# Patient Record
Sex: Male | Born: 1945 | Race: White | Hispanic: No | Marital: Single | Smoking: Former smoker
Health system: Southern US, Community
[De-identification: ages and names within clinical notes are randomized; demographics above are authoritative.]

## PROBLEM LIST (undated history)

## (undated) DIAGNOSIS — M1612 Unilateral primary osteoarthritis, left hip: Secondary | ICD-10-CM

## (undated) DIAGNOSIS — I10 Essential (primary) hypertension: Secondary | ICD-10-CM

## (undated) DIAGNOSIS — C439 Malignant melanoma of skin, unspecified: Secondary | ICD-10-CM

## (undated) DIAGNOSIS — J449 Chronic obstructive pulmonary disease, unspecified: Secondary | ICD-10-CM

## (undated) DIAGNOSIS — M87051 Idiopathic aseptic necrosis of right femur: Secondary | ICD-10-CM

## (undated) DIAGNOSIS — M87052 Idiopathic aseptic necrosis of left femur: Secondary | ICD-10-CM

## (undated) DIAGNOSIS — F209 Schizophrenia, unspecified: Secondary | ICD-10-CM

## (undated) DIAGNOSIS — Z9981 Dependence on supplemental oxygen: Secondary | ICD-10-CM

## (undated) DIAGNOSIS — K5792 Diverticulitis of intestine, part unspecified, without perforation or abscess without bleeding: Secondary | ICD-10-CM

## (undated) DIAGNOSIS — G4733 Obstructive sleep apnea (adult) (pediatric): Secondary | ICD-10-CM

## (undated) DIAGNOSIS — F419 Anxiety disorder, unspecified: Secondary | ICD-10-CM

## (undated) HISTORY — DX: Diverticulitis of intestine, part unspecified, without perforation or abscess without bleeding: K57.92

## (undated) HISTORY — DX: Obstructive sleep apnea (adult) (pediatric): G47.33

## (undated) HISTORY — DX: Idiopathic aseptic necrosis of left femur: M87.052

## (undated) HISTORY — DX: Essential (primary) hypertension: I10

## (undated) HISTORY — DX: Schizophrenia, unspecified: F20.9

## (undated) HISTORY — DX: Idiopathic aseptic necrosis of left femur: M87.051

## (undated) HISTORY — DX: Malignant melanoma of skin, unspecified: C43.9

## (undated) HISTORY — PX: HERNIA REPAIR: SHX51

## (undated) HISTORY — PX: CHOLECYSTECTOMY: SHX55

---
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
## 2005-01-30 ENCOUNTER — Ambulatory Visit (HOSPITAL_COMMUNITY): Payer: Self-pay | Admitting: Psychiatry

## 2005-02-26 ENCOUNTER — Ambulatory Visit (HOSPITAL_COMMUNITY): Payer: Self-pay | Admitting: Psychiatry

## 2005-04-01 ENCOUNTER — Ambulatory Visit (HOSPITAL_COMMUNITY): Payer: Self-pay | Admitting: Psychiatry

## 2005-07-05 ENCOUNTER — Ambulatory Visit (HOSPITAL_COMMUNITY): Payer: Self-pay | Admitting: Psychiatry

## 2005-09-04 ENCOUNTER — Ambulatory Visit (HOSPITAL_COMMUNITY): Payer: Self-pay | Admitting: Psychiatry

## 2005-09-18 ENCOUNTER — Ambulatory Visit (HOSPITAL_COMMUNITY): Payer: Self-pay | Admitting: Psychiatry

## 2005-11-13 ENCOUNTER — Ambulatory Visit (HOSPITAL_COMMUNITY): Payer: Self-pay | Admitting: Psychiatry

## 2005-12-27 ENCOUNTER — Ambulatory Visit (HOSPITAL_COMMUNITY): Payer: Self-pay | Admitting: Psychiatry

## 2006-02-28 ENCOUNTER — Ambulatory Visit (HOSPITAL_COMMUNITY): Payer: Self-pay | Admitting: Psychiatry

## 2006-05-23 ENCOUNTER — Ambulatory Visit (HOSPITAL_COMMUNITY): Payer: Self-pay | Admitting: Psychiatry

## 2012-05-20 DIAGNOSIS — C439 Malignant melanoma of skin, unspecified: Secondary | ICD-10-CM

## 2012-05-20 HISTORY — DX: Malignant melanoma of skin, unspecified: C43.9

## 2012-10-01 HISTORY — PX: MELANOMA EXCISION: SHX5266

## 2013-01-15 ENCOUNTER — Encounter: Payer: Self-pay | Admitting: Family

## 2013-01-15 ENCOUNTER — Ambulatory Visit (INDEPENDENT_AMBULATORY_CARE_PROVIDER_SITE_OTHER): Payer: Medicare HMO | Admitting: Family

## 2013-01-15 VITALS — BP 100/70 | HR 59 | Temp 97.9°F | Resp 16 | Ht 62.0 in | Wt 266.0 lb

## 2013-01-15 DIAGNOSIS — C439 Malignant melanoma of skin, unspecified: Secondary | ICD-10-CM | POA: Insufficient documentation

## 2013-01-15 DIAGNOSIS — E669 Obesity, unspecified: Secondary | ICD-10-CM

## 2013-01-15 DIAGNOSIS — H6120 Impacted cerumen, unspecified ear: Secondary | ICD-10-CM | POA: Insufficient documentation

## 2013-01-15 DIAGNOSIS — H6123 Impacted cerumen, bilateral: Secondary | ICD-10-CM

## 2013-01-15 DIAGNOSIS — F209 Schizophrenia, unspecified: Secondary | ICD-10-CM

## 2013-01-15 DIAGNOSIS — I10 Essential (primary) hypertension: Secondary | ICD-10-CM

## 2013-01-15 DIAGNOSIS — M109 Gout, unspecified: Secondary | ICD-10-CM | POA: Insufficient documentation

## 2013-01-15 DIAGNOSIS — G4733 Obstructive sleep apnea (adult) (pediatric): Secondary | ICD-10-CM | POA: Insufficient documentation

## 2013-01-15 DIAGNOSIS — E291 Testicular hypofunction: Secondary | ICD-10-CM | POA: Insufficient documentation

## 2013-01-15 LAB — BASIC METABOLIC PANEL
BUN: 15 mg/dL (ref 6–23)
Chloride: 103 mEq/L (ref 96–112)
Glucose, Bld: 92 mg/dL (ref 70–99)
Potassium: 3.9 mEq/L (ref 3.5–5.3)
Sodium: 142 mEq/L (ref 135–145)

## 2013-01-15 LAB — HEPATIC FUNCTION PANEL
ALT: 20 U/L (ref 0–53)
AST: 21 U/L (ref 0–37)
Alkaline Phosphatase: 51 U/L (ref 39–117)
Bilirubin, Direct: 0.1 mg/dL (ref 0.0–0.3)
Total Bilirubin: 0.6 mg/dL (ref 0.3–1.2)

## 2013-01-15 LAB — LIPID PANEL
Cholesterol: 151 mg/dL (ref 0–200)
Total CHOL/HDL Ratio: 2.6 Ratio
VLDL: 24 mg/dL (ref 0–40)

## 2013-01-15 MED ORDER — NONFORMULARY OR COMPOUNDED ITEM: Status: DC

## 2013-01-15 NOTE — Assessment & Plan Note (Signed)
Maintained on CPAP reports feeling well rested in the am's.

## 2013-01-15 NOTE — Assessment & Plan Note (Signed)
Reports rare flares, has colchicine on hand as needed.

## 2013-01-15 NOTE — Assessment & Plan Note (Signed)
Will check testosterone level. Rx provided for his compounded testosterone.

## 2013-01-15 NOTE — Assessment & Plan Note (Signed)
Possibly overtreated, though asymptomatic. Consider decrease atenolol dose next visit if still low.

## 2013-01-15 NOTE — Assessment & Plan Note (Signed)
Ceruminosis is noted.  Wax is removed by syringing and manual debridement. Instructions for home care to prevent wax buildup are given.  

## 2013-01-15 NOTE — Assessment & Plan Note (Signed)
Reports excision 4 months ago by Dr. Charm Barges, dermatology.  Has upcoming skin check scheduled.

## 2013-01-15 NOTE — Progress Notes (Signed)
Subjective:    Patient ID: Kenneth Watts, male    DOB: July 06, 1945, 67 y.o.   MRN: [id number]  HPI  Kenneth Watts is a 67 yr old male who presents today to establish care.  1) hypogonadism- he is currently on a compounded testosterone formula. He is using a compounded cream. He has followed with Dr. Reola Calkins.  He has been on testosterone for 2-3 years.  Reports that he feels well on this compounded cream.    2) HTN- currently maintained on atenolol, hyzaar. Reports that his BP is generally pretty low on this regimen.  Reports hx of "heart walls thickened." He sees Dr. Garnette Gunner in [place].   3) Gout- he uses colcrys prn. Reports that his last flare was 6 months ago.  Declines allopurinol.   4) Schizophrenia- currently maintained on seroquel and geodon. He reports hx of schizophrenia since the age of 75.  Reports that his symptom are well controlled. Glean Salen MD- psychiatry in [place].   5) OSA- has been using cpap qhs, reports waking up feeling rested.   Review of Systems  Constitutional: Negative for unexpected weight change.  HENT: Negative for hearing loss and congestion.   Eyes: Negative for visual disturbance.  Respiratory: Negative for cough and shortness of breath.   Cardiovascular: Negative for chest pain and leg swelling.  Gastrointestinal:       Previous history of diverticulitis  Genitourinary: Negative for dysuria, frequency and hematuria.  Musculoskeletal:       Reports hx of AVN hip pain- no longer having hip pain   Skin: Negative for rash.       Hx melanoma 2014- Dr. Ella Jubilee derm   Psychiatric/Behavioral:       Denies delusions, depression, anxiety   Past Medical History  Diagnosis Date  . Hypertension   . Diverticulitis   . Melanoma   . Schizophrenia   . Avascular necrosis of bones of both hips   . OSA (obstructive sleep apnea)     History   Social History  . Marital Status: Single    Spouse Name: N/A    Number of Children: N/A  . Years  of Education: N/A   Occupational History  . Not on file.   Social History Main Topics  . Smoking status: Former Smoker -- 19 years    Quit date: 05/20/1984  . Smokeless tobacco: Never Used  . Alcohol Use: No  . Drug Use: Not on file  . Sexual Activity: Not on file   Other Topics Concern  . Not on file   Social History Narrative   Works midnight shift as a Electrical engineer   1 son in [place]   1 son in [place]- lives with a significant other   Enjoys working on Animator, goes to [employer]   Girlfriend has a Designer, industrial/product in counselor/education    Has worked in the past as a Location manager in the past.               Past Surgical History  Procedure Laterality Date  . Cholecystectomy      Family History  Problem Relation Age of Onset  . Alcohol abuse Mother   . Hypertension Father   . Cancer Paternal Uncle   . Diabetes Neg Hx   . Heart disease Neg Hx     No Known Allergies  No current outpatient prescriptions on file prior to visit.   No current facility-administered medications  on file prior to visit.    BP 100/70  Pulse 59  Temp(Src) 97.9 F (36.6 C) (Oral)  Resp 16  Ht 5\' 2"  (1.575 m)  Wt 266 lb (120.657 kg)  BMI 48.64 kg/m2  SpO2 97%       Objective:   Physical Exam  Constitutional: He appears well-developed and well-nourished. No distress.  HENT:  Head: Normocephalic and atraumatic.  Mouth/Throat: No oropharyngeal exudate, posterior oropharyngeal edema, posterior oropharyngeal erythema or tonsillar abscesses.  Bilateral TM's occluded by cerumen. After cerumen removal- normal bilateral TM's are noted.   Eyes: No scleral icterus.  Cardiovascular: Normal rate and regular rhythm.   No murmur heard. Pulmonary/Chest: Effort normal and breath sounds normal. No respiratory distress. He has no wheezes. He has no rales. He exhibits no tenderness.  Musculoskeletal: He exhibits no edema.  Lymphadenopathy:    He has no  cervical adenopathy.  Neurological: He is alert.  Skin: Skin is warm and dry.  Psychiatric: He has a normal mood and affect. His behavior is normal. Judgment and thought content normal.  Denies current depression/anxiety, delusions          Assessment & Plan:

## 2013-01-15 NOTE — Assessment & Plan Note (Signed)
Well controlled and managed by psychiatry 

## 2013-01-15 NOTE — Patient Instructions (Addendum)
Please complete your lab work prior to leaving. Follow up in 3 months for a wellness visit. Welcome to [facility]!

## 2013-01-19 LAB — TESTOSTERONE, FREE, TOTAL, SHBG
Sex Hormone Binding: 66 nmol/L (ref 13–71)
Testosterone-% Free: 1.2 % — ABNORMAL LOW (ref 1.6–2.9)
Testosterone: 267 ng/dL — ABNORMAL LOW (ref 300–890)

## 2013-02-12 ENCOUNTER — Telehealth: Payer: Self-pay | Admitting: *Deleted

## 2013-02-12 DIAGNOSIS — M109 Gout, unspecified: Secondary | ICD-10-CM

## 2013-02-12 MED ORDER — FEBUXOSTAT 40 MG PO TABS: 40.0000 mg | ORAL_TABLET | Freq: Every day | ORAL | Status: DC

## 2013-02-12 MED ORDER — COLCHICINE 0.6 MG PO TABS: 0.6000 mg | ORAL_TABLET | Freq: Every day | ORAL | Status: DC | PRN

## 2013-02-12 NOTE — Telephone Encounter (Signed)
Colchicine sent, lab order entered, pt notified.

## 2013-02-12 NOTE — Telephone Encounter (Signed)
Kenneth Watts/Patient Phone [phone] returned call to [facility].  Per Epic message below, confirmed pharmacy is [facility] in [place] [phone].  Instructed to return to lab in 2 weeks for uric acid level. Also per MD orders, instructed to take Colchicine once daily for the next 1-2 weeks while starting Uloric as it may temporarily worsen gout.    Kenneth Watts needs refill for Colchicine/Colcrys; has 5 tabs remaining.  Please also send it to [facility] as well.    Please call Kenneth Watts regarding lab appointment date/time; needs to be on a Thursday or Friday.

## 2013-02-12 NOTE — Telephone Encounter (Signed)
Left message to return my call.  

## 2013-02-12 NOTE — Telephone Encounter (Signed)
See pended rx.  I don't see [facility] in epic- could you please check? He should return to the lab in 2 weeks for uric acid level, dx is gout.  Take colchicine once daily for the next 1-2 weeks while starting uloric as it may temporarily worsen gout.

## 2013-02-12 NOTE — Telephone Encounter (Signed)
Received call back from pt that he spoke with pharmacy and they had not received our eRxs. Advised pt that it may take more time for the Rxs to transmit electronically so I called rxs verbally to [place] at [facility] as pt is wanting to pick them up today. Notified pt.

## 2013-02-12 NOTE — Telephone Encounter (Signed)
Pt left message that he has had 2 gout flares in the last month and would like Rx for Uloric sent to [facility].  Please advise.

## 2013-02-15 ENCOUNTER — Telehealth: Payer: Self-pay | Admitting: *Deleted

## 2013-02-15 NOTE — Telephone Encounter (Signed)
[facility] Triage Call Report Triage Record Num: [id number] Operator: Vira Browns Patient Name: Kenneth Watts Call Date & Time: 02/12/2013 4:54:58PM Patient Phone: [phone] PCP: Sandford Craze, NP Patient Gender: Male PCP Fax : [phone] Patient DOB: 1946/04/15 Practice Name: [facility] Reason for Call: Caller: Kenneth Watts/Patient; PCP: Peggyann Juba, Melissa (Adults only); CB#: [phone]; Call regarding Returning phone call; Pt states he spoke with Nicki Guadalajara in the office and medications were sent in to [facility]. Pt spoke with [employer] and the [place] requires a prior authorization. Due to the office being closed, pt advised that prior authorizations cannot be obtained over the weekend and he would need to follow up with the office on Monday 02/15/13. Pt agreed. Protocol(s) Used: Office Note Recommended Outcome per Protocol: Information Noted and Sent to Office Reason for Outcome: Caller information to office Care Advice:

## 2013-02-15 NOTE — Telephone Encounter (Signed)
Called [facility] Medicare [id number] and spoke with CSR.  Requested form be faxed to the office.  Awaiting form. Spoke with pt re: status and left samples at front desk for pick up. Advised him he will take 1/2 tablet of Uloric sample daily as it is 80mg  and he has been prescribed 40mg  daily. Pt voices understanding.

## 2013-02-15 NOTE — Telephone Encounter (Signed)
Caller: Kenneth Watts/Patient; Phone: [phone]; Reason for Call: Pt is returning a call from Trish that he received on 02/12/13; pt believes it is regarding a Rx refill form/authorization that is needed at [facility] for [employer]; Triage RN reviewed in EPIC and explained to pt that office is aware of the pre auth and office is working on that now; pt requesting that this be taken care of today;instructed pt to check back at [facility] a little later today.

## 2013-02-16 NOTE — Telephone Encounter (Signed)
Received form from [facility], forwarded to Provider for completion / signature.

## 2013-02-17 NOTE — Telephone Encounter (Signed)
Form faxed to [employer] at [phone]. Awaiting approval/denial status. 

## 2013-02-17 NOTE — Telephone Encounter (Signed)
Form completed.

## 2013-02-18 MED ORDER — ALLOPURINOL 300 MG PO TABS: 300.0000 mg | ORAL_TABLET | Freq: Every day | ORAL | Status: DC

## 2013-02-18 NOTE — Telephone Encounter (Signed)
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 Evelene Croon- psychiatrist in [place]. Will send rx for allopurinol to his pharmacy.

## 2013-02-18 NOTE — Telephone Encounter (Signed)
Received notice from [facility] that Uloric has been denied as pt has not tried and failed allopurinol. Spoke with pt, he is aware and has picked up Uloric samples. Advised him to complete uloric samples but will most likely be changed to allopurinol. Also pt states he spoke with Provider re: who she recommends for a good male psychiatrist as his daughter is looking for a new one. Please advise.

## 2013-02-19 ENCOUNTER — Telehealth: Payer: Self-pay | Admitting: Family

## 2013-02-19 MED ORDER — ALLOPURINOL 300 MG PO TABS: 300.0000 mg | ORAL_TABLET | Freq: Every day | ORAL | Status: DC

## 2013-02-19 NOTE — Telephone Encounter (Signed)
Referral was not recommended for pt. Pt previously asked on behalf of his daughter who is not a pt here. Advised him that he should check with his daughter's insurance plan for a list of preferred providers and start there.

## 2013-02-19 NOTE — Telephone Encounter (Signed)
Patient states that the psychiatrist that we referred him to, Dr. Evelene Croon does not take Medicare patients. He needs new referral.

## 2013-02-19 NOTE — Telephone Encounter (Signed)
Notified pt. He prefers rx to go to [facility]. Rx cancelled at [facility] and [facility] to [facility].

## 2013-03-04 LAB — URIC ACID: Uric Acid, Serum: 4.4 mg/dL (ref 4.0–7.8)

## 2013-03-05 ENCOUNTER — Encounter: Payer: Self-pay | Admitting: Family

## 2013-03-18 ENCOUNTER — Other Ambulatory Visit: Payer: Self-pay | Admitting: *Deleted

## 2013-03-18 MED ORDER — LOSARTAN POTASSIUM-HCTZ 100-25 MG PO TABS: 1.0000 | ORAL_TABLET | Freq: Every day | ORAL | Status: DC

## 2013-03-18 NOTE — Telephone Encounter (Signed)
Rx request to pharmacy/SLS  

## 2013-03-19 ENCOUNTER — Other Ambulatory Visit: Payer: Self-pay | Admitting: *Deleted

## 2013-03-19 MED ORDER — LOSARTAN POTASSIUM-HCTZ 100-25 MG PO TABS: 1.0000 | ORAL_TABLET | Freq: Every day | ORAL | Status: DC

## 2013-03-19 NOTE — Progress Notes (Signed)
Rx went to mail order pharmacy in error 10.30.14; correctly sent to local pharmacy/SLS

## 2013-03-25 ENCOUNTER — Other Ambulatory Visit: Payer: Self-pay

## 2013-04-06 ENCOUNTER — Ambulatory Visit (INDEPENDENT_AMBULATORY_CARE_PROVIDER_SITE_OTHER): Payer: Medicare HMO | Admitting: Family

## 2013-04-06 ENCOUNTER — Encounter: Payer: Self-pay | Admitting: Family

## 2013-04-06 VITALS — BP 126/78 | HR 71 | Temp 98.2°F | Resp 18 | Ht 62.0 in | Wt 275.8 lb

## 2013-04-06 DIAGNOSIS — Z23 Encounter for immunization: Secondary | ICD-10-CM

## 2013-04-06 DIAGNOSIS — I10 Essential (primary) hypertension: Secondary | ICD-10-CM

## 2013-04-06 DIAGNOSIS — H9202 Otalgia, left ear: Secondary | ICD-10-CM | POA: Insufficient documentation

## 2013-04-06 DIAGNOSIS — H9209 Otalgia, unspecified ear: Secondary | ICD-10-CM

## 2013-04-06 MED ORDER — LOSARTAN POTASSIUM-HCTZ 100-25 MG PO TABS: 1.0000 | ORAL_TABLET | Freq: Every day | ORAL | Status: DC

## 2013-04-06 MED ORDER — ATENOLOL 50 MG PO TABS: 50.0000 mg | ORAL_TABLET | Freq: Every day | ORAL | Status: DC

## 2013-04-06 NOTE — Assessment & Plan Note (Signed)
BP looks good today. Continue current meds. Refills sent.

## 2013-04-06 NOTE — Progress Notes (Signed)
Subjective:    Patient ID: Kenneth Watts, male    DOB: 1945/06/23, 67 y.o.   MRN: [id number]  HPI  Kenneth Watts is a 67 yr old male who presents today with chief complaint of otalgia. Pain located in the left ear. Started 1 week ago. Worsened overnight.  Reports very mild sore throat few days back but this has resolved.  No nasal drainage. Reports mild wheezing.   HTN-   BP Readings from Last 3 Encounters:  04/06/13 126/78  01/15/13 100/70  Continues hyzaar and atenolol. Denies CP/SOB.  Would like Tdap today.  Review of Systems See HPI  Past Medical History  Diagnosis Date  . Hypertension   . Diverticulitis   . Melanoma   . Schizophrenia   . Avascular necrosis of bones of both hips   . OSA (obstructive sleep apnea)     History   Social History  . Marital Status: Single    Spouse Name: N/A    Number of Children: N/A  . Years of Education: N/A   Occupational History  . Not on file.   Social History Main Topics  . Smoking status: Former Smoker -- 19 years    Quit date: 05/20/1984  . Smokeless tobacco: Never Used  . Alcohol Use: No  . Drug Use: Not on file  . Sexual Activity: Not on file   Other Topics Concern  . Not on file   Social History Narrative   Works midnight shift as a Electrical engineer   1 son in [place]   1 son in [place]- lives with a significant other   Enjoys working on Animator, goes to [employer]   Girlfriend has a Designer, industrial/product in counselor/education    Has worked in the past as a Location manager in the past.               Past Surgical History  Procedure Laterality Date  . Cholecystectomy      Family History  Problem Relation Age of Onset  . Alcohol abuse Mother   . Hypertension Father   . Cancer Paternal Uncle   . Diabetes Neg Hx   . Heart disease Neg Hx     No Known Allergies  Current Outpatient Prescriptions on File Prior to Visit  Medication Sig Dispense Refill  . allopurinol (ZYLOPRIM)  300 MG tablet Take 1 tablet (300 mg total) by mouth daily.  90 tablet  1  . atenolol (TENORMIN) 50 MG tablet Take 50 mg by mouth daily.      . colchicine 0.6 MG tablet Take 1 tablet (0.6 mg total) by mouth daily as needed.  14 tablet  0  . losartan-hydrochlorothiazide (HYZAAR) 100-25 MG per tablet Take 1 tablet by mouth daily.  30 tablet  0  . NONFORMULARY OR COMPOUNDED ITEM Testosterone 5%  Apply 4 clicks to skin once a day. (Compounded by [facility])  30 each  0  . QUEtiapine (SEROQUEL) 100 MG tablet Take 200 mg by mouth at bedtime.      . ziprasidone (GEODON) 80 MG capsule Take 80 mg by mouth 2 (two) times daily.       No current facility-administered medications on file prior to visit.    BP 126/78  Pulse 71  Temp(Src) 98.2 F (36.8 C) (Oral)  Resp 18  Ht 5\' 2"  (1.575 m)  Wt 275 lb 12 oz (125.079 kg)  BMI 50.42 kg/m2  SpO2 96%  Objective:   Physical Exam  Constitutional: He appears well-developed and well-nourished. No distress.  HENT:  Head: Normocephalic and atraumatic.  Right Ear: Tympanic membrane and ear canal normal. Tympanic membrane is not injected.  Left Ear: Tympanic membrane and ear canal normal. Tympanic membrane is not injected.  Mouth/Throat: No oropharyngeal exudate, posterior oropharyngeal edema or posterior oropharyngeal erythema.  Cardiovascular: Normal rate and regular rhythm.   No murmur heard. Pulmonary/Chest: Effort normal and breath sounds normal. No respiratory distress. He has no wheezes. He has no rales. He exhibits no tenderness.          Assessment & Plan:

## 2013-04-06 NOTE — Patient Instructions (Signed)
Please use tylenol or motrin as needed for pain. Call if ear pain worsens or if it does not improve in the next 2-3 days. Continue allegra. Keep upcoming appointment for wellness visit.

## 2013-04-06 NOTE — Progress Notes (Signed)
Pre visit review using our clinic review tool, if applicable. No additional management support is needed unless otherwise documented below in the visit note/SLS  

## 2013-04-06 NOTE — Assessment & Plan Note (Signed)
No sign of OM on exam.  Normal TM. Advised pt to continue allegra, add tylenol/motrin as needed.  Call if symptoms worsen or if not improved in 2-3 days.

## 2013-04-21 ENCOUNTER — Encounter: Payer: Self-pay | Admitting: Family

## 2013-04-21 ENCOUNTER — Ambulatory Visit (INDEPENDENT_AMBULATORY_CARE_PROVIDER_SITE_OTHER): Payer: Medicare HMO | Admitting: Family

## 2013-04-21 VITALS — BP 100/80 | HR 66 | Temp 98.9°F | Resp 16 | Ht 62.0 in | Wt 277.1 lb

## 2013-04-21 DIAGNOSIS — J069 Acute upper respiratory infection, unspecified: Secondary | ICD-10-CM

## 2013-04-21 DIAGNOSIS — I10 Essential (primary) hypertension: Secondary | ICD-10-CM

## 2013-04-21 MED ORDER — FLUTICASONE PROPIONATE 50 MCG/ACT NA SUSP: 2.0000 | Freq: Every day | NASAL | Status: DC

## 2013-04-21 MED ORDER — ATENOLOL 25 MG PO TABS: 25.0000 mg | ORAL_TABLET | Freq: Every day | ORAL | Status: DC

## 2013-04-21 NOTE — Progress Notes (Signed)
Subjective:    Patient ID: Kenneth Watts, male    DOB: [birth year], 67 y.o.   MRN: [id number]  HPI  [name] is a 67 yr old male who presents today with chief complaint of nasal congestion. Started as a cold 6 days ago.  Notes nasal drainage worsened.  Drainage is clear.  Denies associated fever. Reports poor energy for the last few days. Reports mild associated cough but this is improving.   HTN- he is requesting that atenolol be decreased due to low bp.    Review of Systems See HPI  Past Medical History  Diagnosis Date  . Hypertension   . Diverticulitis   . Melanoma   . Schizophrenia   . Avascular necrosis of bones of both hips   . OSA (obstructive sleep apnea)     History   Social History  . Marital Status: Single    Spouse Name: N/A    Number of Children: N/A  . Years of Education: N/A   Occupational History  . Not on file.   Social History Main Topics  . Smoking status: Former Smoker -- 19 years    Quit date: 05/20/1984  . Smokeless tobacco: Never Used  . Alcohol Use: No  . Drug Use: Not on file  . Sexual Activity: Not on file   Other Topics Concern  . Not on file   Social History Narrative   Works midnight shift as a Electrical engineer   1 son in [place]   1 son in [place]- lives with a significant other   Enjoys working on Animator, goes to [employer]   Girlfriend has a Designer, industrial/product in counselor/education    Has worked in the past as a Location manager in the past.               Past Surgical History  Procedure Laterality Date  . Cholecystectomy      Family History  Problem Relation Age of Onset  . Alcohol abuse Mother   . Hypertension Father   . Cancer Paternal Uncle   . Diabetes Neg Hx   . Heart disease Neg Hx     No Known Allergies  Current Outpatient Prescriptions on File Prior to Visit  Medication Sig Dispense Refill  . allopurinol (ZYLOPRIM) 300 MG tablet Take 1 tablet (300 mg total) by mouth  daily.  90 tablet  1  . atenolol (TENORMIN) 50 MG tablet Take 1 tablet (50 mg total) by mouth daily.  90 tablet  1  . colchicine 0.6 MG tablet Take 1 tablet (0.6 mg total) by mouth daily as needed.  14 tablet  0  . losartan-hydrochlorothiazide (HYZAAR) 100-25 MG per tablet Take 1 tablet by mouth daily.  90 tablet  1  . NONFORMULARY OR COMPOUNDED ITEM Testosterone 5%  Apply 4 clicks to skin once a day. (Compounded by [facility])  30 each  0  . QUEtiapine (SEROQUEL) 100 MG tablet Take 200 mg by mouth at bedtime.       No current facility-administered medications on file prior to visit.    BP 100/80  Pulse 66  Temp(Src) 98.9 F (37.2 C) (Oral)  Resp 16  Ht 5\' 2"  (1.575 m)  Wt 277 lb 1.3 oz (125.683 kg)  BMI 50.67 kg/m2  SpO2 96%       Objective:   Physical Exam  Constitutional: He appears well-developed and well-nourished. No distress.  HENT:  Head: Normocephalic  and atraumatic.  Right Ear: Tympanic membrane and ear canal normal.  Left Ear: Tympanic membrane and ear canal normal.  Nose: Rhinorrhea present. No mucosal edema. Right sinus exhibits no maxillary sinus tenderness and no frontal sinus tenderness. Left sinus exhibits no maxillary sinus tenderness and no frontal sinus tenderness.  Mouth/Throat: No oropharyngeal exudate or posterior oropharyngeal edema.  Cardiovascular: Normal rate and regular rhythm.   No murmur heard. Pulmonary/Chest: Effort normal and breath sounds normal. No respiratory distress. He has no wheezes. He has no rales. He exhibits no tenderness.          Assessment & Plan:

## 2013-04-21 NOTE — Assessment & Plan Note (Addendum)
Decrease atenolol to 25mg  bid. He will call me in 1 week with his follow up BP readings.

## 2013-04-21 NOTE — Assessment & Plan Note (Signed)
Symptoms likely still viral at this point. Advised pt to call if symptoms worsen or if not improved by Friday- he verbalizes understanding.  Will add flonase.

## 2013-04-21 NOTE — Patient Instructions (Signed)
Continue allegra, add flonase. Call if you develop fever, increased pain/pressure in the sinus, if nasal drainage worsens, or if not improved in 2-3 days.

## 2013-04-23 ENCOUNTER — Encounter: Payer: Medicare HMO | Admitting: Family

## 2013-05-07 ENCOUNTER — Encounter: Payer: Medicare HMO | Admitting: Family

## 2013-05-28 ENCOUNTER — Encounter: Payer: Medicare HMO | Admitting: Family

## 2013-06-03 ENCOUNTER — Telehealth: Payer: Self-pay | Admitting: Family

## 2013-06-03 NOTE — Telephone Encounter (Signed)
refill- testosterone 5% hrt (men). Apply four clicks to skin once a day. Qty 30 last fill 10.20.14

## 2013-06-04 ENCOUNTER — Encounter: Payer: Medicare HMO | Admitting: Family

## 2013-06-04 MED ORDER — NONFORMULARY OR COMPOUNDED ITEM: Status: DC

## 2013-06-04 NOTE — Telephone Encounter (Signed)
See rx. 

## 2013-06-04 NOTE — Telephone Encounter (Signed)
Rx faxed to [facility]. 

## 2013-06-04 NOTE — Telephone Encounter (Signed)
Called pharmacy and confirmed that Rx filled on 10.20.14 was the px printed for patient on 08.29.14 via PCP/SLS Please Advise.

## 2013-07-09 ENCOUNTER — Ambulatory Visit: Payer: Medicare HMO | Admitting: Family

## 2013-07-16 ENCOUNTER — Ambulatory Visit (INDEPENDENT_AMBULATORY_CARE_PROVIDER_SITE_OTHER): Payer: Medicare HMO | Admitting: Family

## 2013-07-16 ENCOUNTER — Telehealth: Payer: Self-pay | Admitting: *Deleted

## 2013-07-16 ENCOUNTER — Encounter: Payer: Self-pay | Admitting: Family

## 2013-07-16 ENCOUNTER — Other Ambulatory Visit: Payer: Self-pay | Admitting: Family

## 2013-07-16 VITALS — BP 102/80 | HR 78 | Temp 97.7°F | Resp 16 | Ht 72.0 in | Wt 257.0 lb

## 2013-07-16 DIAGNOSIS — Z23 Encounter for immunization: Secondary | ICD-10-CM

## 2013-07-16 DIAGNOSIS — R9431 Abnormal electrocardiogram [ECG] [EKG]: Secondary | ICD-10-CM

## 2013-07-16 DIAGNOSIS — M109 Gout, unspecified: Secondary | ICD-10-CM

## 2013-07-16 DIAGNOSIS — E291 Testicular hypofunction: Secondary | ICD-10-CM

## 2013-07-16 DIAGNOSIS — I10 Essential (primary) hypertension: Secondary | ICD-10-CM

## 2013-07-16 DIAGNOSIS — Z0000 Encounter for general adult medical examination without abnormal findings: Secondary | ICD-10-CM

## 2013-07-16 DIAGNOSIS — E876 Hypokalemia: Secondary | ICD-10-CM

## 2013-07-16 DIAGNOSIS — Z125 Encounter for screening for malignant neoplasm of prostate: Secondary | ICD-10-CM

## 2013-07-16 DIAGNOSIS — G4733 Obstructive sleep apnea (adult) (pediatric): Secondary | ICD-10-CM

## 2013-07-16 DIAGNOSIS — F209 Schizophrenia, unspecified: Secondary | ICD-10-CM

## 2013-07-16 DIAGNOSIS — C439 Malignant melanoma of skin, unspecified: Secondary | ICD-10-CM

## 2013-07-16 LAB — BASIC METABOLIC PANEL
BUN: 12 mg/dL (ref 6–23)
CALCIUM: 9.1 mg/dL (ref 8.4–10.5)
CO2: 28 mEq/L (ref 19–32)
CREATININE: 1.18 mg/dL (ref 0.50–1.35)
Chloride: 101 mEq/L (ref 96–112)
GLUCOSE: 98 mg/dL (ref 70–99)
Potassium: 3.4 mEq/L — ABNORMAL LOW (ref 3.5–5.3)
Sodium: 141 mEq/L (ref 135–145)

## 2013-07-16 LAB — CBC WITH DIFFERENTIAL/PLATELET
Basophils Absolute: 0.1 10*3/uL (ref 0.0–0.1)
Basophils Relative: 1 % (ref 0–1)
Eosinophils Absolute: 0.2 10*3/uL (ref 0.0–0.7)
Eosinophils Relative: 3 % (ref 0–5)
HCT: 46.6 % (ref 39.0–52.0)
Hemoglobin: 15.9 g/dL (ref 13.0–17.0)
LYMPHS PCT: 35 % (ref 12–46)
Lymphs Abs: 2.2 10*3/uL (ref 0.7–4.0)
MCH: 29.9 pg (ref 26.0–34.0)
MCHC: 34.1 g/dL (ref 30.0–36.0)
MCV: 87.8 fL (ref 78.0–100.0)
Monocytes Absolute: 0.5 10*3/uL (ref 0.1–1.0)
Monocytes Relative: 8 % (ref 3–12)
NEUTROS ABS: 3.3 10*3/uL (ref 1.7–7.7)
NEUTROS PCT: 53 % (ref 43–77)
Platelets: 213 10*3/uL (ref 150–400)
RBC: 5.31 MIL/uL (ref 4.22–5.81)
RDW: 13.9 % (ref 11.5–15.5)
WBC: 6.3 10*3/uL (ref 4.0–10.5)

## 2013-07-16 LAB — LIPID PANEL
Cholesterol: 154 mg/dL (ref 0–200)
HDL: 60 mg/dL (ref >39)
LDL CALC: 70 mg/dL (ref 0–99)
TRIGLYCERIDES: 118 mg/dL (ref <150)
Total CHOL/HDL Ratio: 2.6 Ratio
VLDL: 24 mg/dL (ref 0–40)

## 2013-07-16 LAB — PSA, MEDICARE: PSA: 1.88 ng/mL (ref <=4.00)

## 2013-07-16 MED ORDER — ALLOPURINOL 300 MG PO TABS: 300.0000 mg | ORAL_TABLET | Freq: Every day | ORAL | Status: DC

## 2013-07-16 MED ORDER — LOSARTAN POTASSIUM-HCTZ 50-12.5 MG PO TABS: 1.0000 | ORAL_TABLET | Freq: Every day | ORAL | Status: DC

## 2013-07-16 MED ORDER — NONFORMULARY OR COMPOUNDED ITEM
Status: DC
Start: 2013-07-16 — End: 2013-09-24

## 2013-07-16 NOTE — Assessment & Plan Note (Signed)
Stable on current dose of allopurinol. Continue same.

## 2013-07-16 NOTE — Assessment & Plan Note (Signed)
Overtreated now that he has lost 20 pounds.  Will cut hyzaar from 100/25 to 50/12.5.  Follow up in 1 month for bmet and bp check.

## 2013-07-16 NOTE — Assessment & Plan Note (Signed)
Clinically stable. Obtain psa, testosterone, cbc.

## 2013-07-16 NOTE — Progress Notes (Signed)
Pre visit review using our clinic review tool, if applicable. No additional management support is needed unless otherwise documented below in the visit note. 

## 2013-07-16 NOTE — Assessment & Plan Note (Signed)
Stable, continue cpap.  

## 2013-07-16 NOTE — Patient Instructions (Signed)
Please complete lab work prior to leaving. Try to add regular exercise such as walking to your daily routine. Keep up the good work with healthy diet and weight loss! We are changing your losartan hctz dose from 100/25mg  to 50/12.5 mg and the new rx has [facility] to [facility]. Please follow up in 1 month for a nurse visit follow up blood work and and 6 months for a regular office visit.

## 2013-07-16 NOTE — Assessment & Plan Note (Signed)
Stable on current meds. Management per psychiatry.  

## 2013-07-16 NOTE — Assessment & Plan Note (Signed)
EKG performed today is reviewed and notes: RBBB and left bifasicular block.  Asymptomatic.  Refer to cardiology for further evaluation.

## 2013-07-16 NOTE — Progress Notes (Signed)
Subjective:    Patient ID: Kenneth Watts, male    DOB: 1946-02-25, 68 y.o.   MRN: [id number]  HPI  Subjective:   Patient here for Medicare annual wellness visit and management of other chronic and acute problems.  Obesity-  Has been dieting.    Wt Readings from Last 3 Encounters:  07/16/13 257 lb (116.574 kg)  04/21/13 277 lb 1.3 oz (125.683 kg)  04/06/13 275 lb 12 oz (125.079 kg)  Not exercising.  Gout- maintained on allopurinol.  Reports well controlled.   HTN- current BP meds include hyzaar.   BP Readings from Last 3 Encounters:  07/16/13 102/80  04/21/13 100/80  04/06/13 126/78   Hypogonadism- he continues topical testosterone.   Melanoma- May 15th, [facility].  He is following every 6 months for skin checks.    OSA- patient is maintained on CPAP. Reports that his has an adjustable bed which he likes.    Schizophrenia- currently maintained on geodon and seroquel. He is followed by psychiatry. Reports well controlled.  He uses melatonin for sleep.    Risk factors: at risk for recurrent melanoma.   Roster of Physicians Providing Medical Care to Patient: Dr. Virl Axe- psychiatry  Activities of Daily Living  In your present state of health, do you have any difficulty performing the following activities? Preparing food and eating?: No  Bathing yourself: No  Getting dressed: No  Using the toilet:No  Moving around from place to place: No  In the past year have you fallen or had a near fall?:No     Home Safety: Has smoke detector and wears seat belts. No firearms. Reports that he does not wear sunscreen.  Diet and Exercise  Current exercise habits: none Dietary issues discussed: healthy diet   Depression Screen  (Note: if answer to either of the following is "Yes", then a more complete depression screening is indicated)  Q1: Over the past two weeks, have you felt down, depressed or hopeless?no  Q2: Over the past two weeks, have you felt  little interest or pleasure in doing things? no   The following portions of the patient's history were reviewed and updated as appropriate: allergies, current medications, past family history, past medical history, past social history, past surgical history and problem list.   Objective:   Vision: see nursing Hearing: able to hear forced whisper at 6 feet.  Body mass index: Body mass index is 34.85 kg/(m^2). Cognitive Impairment Assessment: cognition, memory and judgment appear normal.   Assessment:   Medicare wellness utd on preventive parameters  Declines screening for AAA  Plan:    During the course of the visit the patient was educated and counseled about appropriate screening and preventive services including:   Sun screen use, sun avoidance  Diabetes screening  Nutrition counseling   Vaccines / LABS  Prevnar today Patient Instructions (the written plan) was given to the patient.       Review of Systems  Constitutional: Negative for unexpected weight change.  HENT: Negative for hearing loss and postnasal drip.   Eyes: Negative for visual disturbance.  Respiratory: Negative for cough and shortness of breath.   Cardiovascular: Negative for chest pain.  Gastrointestinal: Negative for vomiting, constipation and blood in stool.       Reports that he had some epigastric pain, took nexium symptoms resolved.    Genitourinary: Negative for dysuria and frequency.  Musculoskeletal: Negative for arthralgias and myalgias.  Skin: Negative for rash.  Neurological: Negative for  headaches.  Hematological: Negative for adenopathy.  Psychiatric/Behavioral:       Stable, see HPI   Past Medical History  Diagnosis Date  . Hypertension   . Diverticulitis   . Melanoma   . Schizophrenia   . Avascular necrosis of bones of both hips   . OSA (obstructive sleep apnea)     History   Social History  . Marital Status: Single    Spouse Name: N/A    Number of Children: N/A  . Years  of Education: N/A   Occupational History  . Not on file.   Social History Main Topics  . Smoking status: Former Smoker -- 19 years    Quit date: 05/20/1984  . Smokeless tobacco: Never Used  . Alcohol Use: No  . Drug Use: Not on file  . Sexual Activity: Not on file   Other Topics Concern  . Not on file   Social History Narrative   Works midnight shift as a Presenter, broadcasting   1 son in [place]   1 son in [place]- lives with a significant other   Enjoys working on Teaching laboratory technician, goes to [employer]   Girlfriend has a Architectural technologist in counselor/education    Has worked in the past as a Community education officer in the past.               Past Surgical History  Procedure Laterality Date  . Cholecystectomy    . Melanoma excision Left 10/01/12    arm    Family History  Problem Relation Age of Onset  . Alcohol abuse Mother   . Hypertension Father   . Cancer Paternal Uncle   . Diabetes Neg Hx   . Heart disease Neg Hx     No Known Allergies  Current Outpatient Prescriptions on File Prior to Visit  Medication Sig Dispense Refill  . Fexofenadine-Pseudoephedrine (ALLEGRA-D PO) Take 10 mg by mouth daily.      . QUEtiapine (SEROQUEL) 100 MG tablet Take 100 mg by mouth at bedtime.       . ziprasidone (GEODON) 60 MG capsule Take 80 mg by mouth daily.       . fluticasone (FLONASE) 50 MCG/ACT nasal spray Place 2 sprays into both nostrils daily.  16 g  6   No current facility-administered medications on file prior to visit.    BP 102/80  Pulse 78  Temp(Src) 97.7 F (36.5 C) (Oral)  Resp 16  Ht 6' (1.829 m)  Wt 257 lb (116.574 kg)  BMI 34.85 kg/m2  SpO2 98%       Objective:   Physical Exam  Physical Exam  Constitutional: He is oriented to person, place, and time. He appears well-developed and well-nourished. No distress.  HENT:  Head: Normocephalic and atraumatic.  Right Ear: Tympanic membrane and ear canal normal.  Left Ear: Tympanic membrane and  ear canal normal.  Mouth/Throat: Oropharynx is clear and moist.  Eyes: Pupils are equal, round, and reactive to light. No scleral icterus.  Neck: Normal range of motion. No thyromegaly present.  Cardiovascular: Normal rate and regular rhythm.   No murmur heard. Pulmonary/Chest: Effort normal and breath sounds normal. No respiratory distress. He has no wheezes. He has no rales. He exhibits no tenderness.  Abdominal: Soft. Bowel sounds are normal. He exhibits no distension and no mass. There is no tenderness. There is no rebound and no guarding.  Musculoskeletal: He exhibits no edema.  Lymphadenopathy:  He has no cervical adenopathy.  Neurological: He is alert and oriented to person, place, and time. He exhibits normal muscle tone. Coordination normal.  Skin: Skin is warm and dry.  Psychiatric: He has a normal mood and affect. His behavior is normal. Judgment and thought content normal.          Assessment & Plan:         Assessment & Plan:

## 2013-07-16 NOTE — Assessment & Plan Note (Signed)
He is following with dermatology for routine skin monitoring.

## 2013-07-16 NOTE — Telephone Encounter (Signed)
Pt called back stating he would like to see [facility] for his abnormal EKG and he will call us back with the cardiologist's name that he would like to see.

## 2013-07-19 ENCOUNTER — Telehealth: Payer: Self-pay | Admitting: Family

## 2013-07-19 LAB — TESTOSTERONE, FREE, TOTAL, SHBG
SEX HORMONE BINDING: 57 nmol/L (ref 13–71)
TESTOSTERONE: 442 ng/dL (ref 300–890)
Testosterone, Free: 62.9 pg/mL (ref 47.0–244.0)
Testosterone-% Free: 1.4 % — ABNORMAL LOW (ref 1.6–2.9)

## 2013-07-19 NOTE — Telephone Encounter (Signed)
Relevant patient education assigned to patient using Emmi. ° °

## 2013-07-20 MED ORDER — POTASSIUM CHLORIDE CRYS ER 20 MEQ PO TBCR: 20.0000 meq | EXTENDED_RELEASE_TABLET | Freq: Every day | ORAL | Status: DC

## 2013-07-20 NOTE — Telephone Encounter (Signed)
Left message for pt to return my call.

## 2013-07-20 NOTE — Telephone Encounter (Signed)
Please call pt and let him know that his cholesterol looks good.  Testosterone level looks good on current dose of testosterone- continue same.  PSA normal.  Potassium is mildly low- likely due to HCTZ in his bp med. I would like him to add Kdur supplement once daily and repeat bmet in 2 weeks dx is hypothyroid. Also, who would he like for [place] to refer him to at [facility]?

## 2013-07-21 NOTE — Telephone Encounter (Signed)
Notified pt and he voices understanding. Lab order entered. Pt would like to see [facility] per pt.

## 2013-08-03 ENCOUNTER — Telehealth: Payer: Self-pay | Admitting: *Deleted

## 2013-08-03 NOTE — Telephone Encounter (Signed)
Received message from pt that form was being sent to [place] re: plasmapheresis. Form received and forwarded to Provider for completion. Pt requests form be faxed back to [employer] (# is on form). Call pt when complete.  Please advise.

## 2013-08-04 NOTE — Telephone Encounter (Signed)
Pt called back and is requesting form be faxed to [employer] by today.

## 2013-08-04 NOTE — Telephone Encounter (Signed)
Form faxed to [phone]. Left message on pt's voicemail that form was completed and faxed and to call if any questions.

## 2013-08-04 NOTE — Telephone Encounter (Signed)
Completed.

## 2013-08-06 LAB — BASIC METABOLIC PANEL
BUN: 17 mg/dL (ref 6–23)
CO2: 25 meq/L (ref 19–32)
CREATININE: 1.27 mg/dL (ref 0.50–1.35)
Calcium: 8.8 mg/dL (ref 8.4–10.5)
Chloride: 106 mEq/L (ref 96–112)
Glucose, Bld: 89 mg/dL (ref 70–99)
POTASSIUM: 4.3 meq/L (ref 3.5–5.3)
Sodium: 139 mEq/L (ref 135–145)

## 2013-08-08 ENCOUNTER — Encounter: Payer: Self-pay | Admitting: Family

## 2013-08-27 ENCOUNTER — Ambulatory Visit: Payer: Medicare HMO | Admitting: Family

## 2013-09-24 ENCOUNTER — Ambulatory Visit (INDEPENDENT_AMBULATORY_CARE_PROVIDER_SITE_OTHER): Payer: Medicare HMO | Admitting: Family

## 2013-09-24 VITALS — BP 126/86 | HR 76 | Temp 98.2°F | Resp 16 | Wt 264.0 lb

## 2013-09-24 DIAGNOSIS — I10 Essential (primary) hypertension: Secondary | ICD-10-CM

## 2013-09-25 NOTE — Assessment & Plan Note (Signed)
BP looks good off of hyzaar.  Continue off of hyzaar.

## 2013-09-29 ENCOUNTER — Encounter: Payer: Self-pay | Admitting: Family

## 2013-09-29 ENCOUNTER — Ambulatory Visit (INDEPENDENT_AMBULATORY_CARE_PROVIDER_SITE_OTHER): Payer: Medicare HMO | Admitting: Family

## 2013-09-29 VITALS — BP 152/90 | HR 72 | Temp 98.2°F | Resp 18 | Ht 72.0 in | Wt 275.0 lb

## 2013-09-29 DIAGNOSIS — K029 Dental caries, unspecified: Secondary | ICD-10-CM | POA: Insufficient documentation

## 2013-09-29 DIAGNOSIS — I10 Essential (primary) hypertension: Secondary | ICD-10-CM

## 2013-09-29 MED ORDER — AMOXICILLIN 500 MG PO CAPS: 500.0000 mg | ORAL_CAPSULE | Freq: Three times a day (TID) | ORAL | Status: DC

## 2013-09-29 NOTE — Progress Notes (Signed)
Subjective:    Patient ID: Kenneth Watts, male    DOB: [birth year], 68 y.o.   MRN: [id number]  HPI  Mr. Mccorkel is a 68 yr old male who presents today with chief complaint of dental pain.  R bottom molar. Reports that he contacted his dentist re: antibiotic. Was told to contact us to re: antibiotic as they are out of the office all week. Notes pain improves with advil.    Review of Systems See HPI  Past Medical History  Diagnosis Date  . Hypertension   . Diverticulitis   . Melanoma   . Schizophrenia   . Avascular necrosis of bones of both hips   . OSA (obstructive sleep apnea)     History   Social History  . Marital Status: Single    Spouse Name: N/A    Number of Children: N/A  . Years of Education: N/A   Occupational History  . Not on file.   Social History Main Topics  . Smoking status: Former Smoker -- 19 years    Quit date: 05/20/1984  . Smokeless tobacco: Never Used  . Alcohol Use: No  . Drug Use: Not on file  . Sexual Activity: Not on file   Other Topics Concern  . Not on file   Social History Narrative   Works midnight shift as a Presenter, broadcasting   1 son in [place]   1 son in [place]- lives with a significant other   Enjoys working on Teaching laboratory technician, goes to [employer]   Girlfriend has a Architectural technologist in counselor/education    Has worked in the past as a Community education officer in the past.               Past Surgical History  Procedure Laterality Date  . Cholecystectomy    . Melanoma excision Left 10/01/12    arm    Family History  Problem Relation Age of Onset  . Alcohol abuse Mother   . Hypertension Father   . Cancer Paternal Uncle   . Diabetes Neg Hx   . Heart disease Neg Hx     No Known Allergies  Current Outpatient Prescriptions on File Prior to Visit  Medication Sig Dispense Refill  . Fexofenadine-Pseudoephedrine (ALLEGRA-D PO) Take 10 mg by mouth daily.      . Multiple Vitamin (MULTIVITAMIN) tablet Take 1  tablet by mouth daily.      . QUEtiapine (SEROQUEL) 100 MG tablet Take 100 mg by mouth at bedtime.       . ziprasidone (GEODON) 60 MG capsule Take 80 mg by mouth daily.        No current facility-administered medications on file prior to visit.    BP 152/90  Pulse 72  Temp(Src) 98.2 F (36.8 C) (Oral)  Resp 18  Ht 6' (1.829 m)  Wt 275 lb (124.739 kg)  BMI 37.29 kg/m2  SpO2 99%       Objective:   Physical Exam  Constitutional: He appears well-developed and well-nourished. No distress.  HENT:  Head: Normocephalic and atraumatic.  R lower posterior molar with filling and what appears to be caries. Also noted to have edge of tooth broken off. No significant surrounding gum swelling is noted.  Cardiovascular: Normal rate and regular rhythm.   No murmur heard. Pulmonary/Chest: Effort normal and breath sounds normal. No respiratory distress. He has no wheezes. He has no rales. He exhibits no tenderness.  Musculoskeletal: He exhibits  no edema.          Assessment & Plan:

## 2013-09-29 NOTE — Assessment & Plan Note (Signed)
Not clear if he has associated infection. Will plan empiric rx with amoxicillin, continue prn ibuprofen. Arrange follow up with dentis.

## 2013-09-29 NOTE — Patient Instructions (Addendum)
Start amoxicillin. Schedule appointment with your dentist. Follow up in 3 months.

## 2013-10-13 ENCOUNTER — Encounter: Payer: Self-pay | Admitting: Family

## 2013-10-13 ENCOUNTER — Ambulatory Visit (INDEPENDENT_AMBULATORY_CARE_PROVIDER_SITE_OTHER): Payer: Medicare HMO | Admitting: Family

## 2013-10-13 VITALS — BP 136/86 | HR 52 | Temp 98.4°F | Resp 16 | Ht 72.0 in | Wt 267.0 lb

## 2013-10-13 DIAGNOSIS — G4700 Insomnia, unspecified: Secondary | ICD-10-CM | POA: Insufficient documentation

## 2013-10-13 MED ORDER — ESZOPICLONE 2 MG PO TABS: ORAL_TABLET | ORAL | Status: DC

## 2013-10-13 NOTE — Progress Notes (Signed)
Subjective:    Patient ID: Kenneth Watts, male    DOB: [birth year], 68 y.o.   MRN: [id number]  HPI  Kenneth Watts is a 68 yr old male who presents today to discuss weight gain.  The patient reports healthy diet but attributes weight gain to seroquel.  Reports that his psychiatrist prescribed the seroquel for him for insomnia and has given him permission to discontinue the seroquel.  Sees psychiatrist in July.   Insomnia- in the past he has tried benadryl in the past without improvement.  He has works nights.  Uses CPAP machine. He has tried [place] without improvement.  Pt would like to try lunesta.  Wt Readings from Last 3 Encounters:  10/13/13 267 lb 0.6 oz (121.129 kg)  09/29/13 275 lb (124.739 kg)  09/24/13 264 lb (119.75 kg)    Review of Systems    see HPI  Past Medical History  Diagnosis Date  . Hypertension   . Diverticulitis   . Melanoma   . Schizophrenia   . Avascular necrosis of bones of both hips   . OSA (obstructive sleep apnea)     History   Social History  . Marital Status: Single    Spouse Name: N/A    Number of Children: N/A  . Years of Education: N/A   Occupational History  . Not on file.   Social History Main Topics  . Smoking status: Former Smoker -- 19 years    Quit date: 05/20/1984  . Smokeless tobacco: Never Used  . Alcohol Use: No  . Drug Use: Not on file  . Sexual Activity: Not on file   Other Topics Concern  . Not on file   Social History Narrative   Works midnight shift as a Presenter, broadcasting   1 son in [place]   1 son in [place]- lives with a significant other   Enjoys working on Teaching laboratory technician, goes to [employer]   Girlfriend has a Architectural technologist in counselor/education    Has worked in the past as a Community education officer in the past.               Past Surgical History  Procedure Laterality Date  . Cholecystectomy    . Melanoma excision Left 10/01/12    arm    Family History  Problem Relation Age of  Onset  . Alcohol abuse Mother   . Hypertension Father   . Cancer Paternal Uncle   . Diabetes Neg Hx   . Heart disease Neg Hx     No Known Allergies  Current Outpatient Prescriptions on File Prior to Visit  Medication Sig Dispense Refill  . Fexofenadine-Pseudoephedrine (ALLEGRA-D PO) Take 10 mg by mouth daily.      . Multiple Vitamin (MULTIVITAMIN) tablet Take 1 tablet by mouth daily.      . QUEtiapine (SEROQUEL) 100 MG tablet Take 100 mg by mouth at bedtime.       . ziprasidone (GEODON) 60 MG capsule Take 80 mg by mouth daily.        No current facility-administered medications on file prior to visit.    BP 136/86  Pulse 52  Temp(Src) 98.4 F (36.9 C) (Oral)  Resp 16  Ht 6' (1.829 m)  Wt 267 lb 0.6 oz (121.129 kg)  BMI 36.21 kg/m2  SpO2 96%    Objective:   Physical Exam  Constitutional: He appears well-developed and well-nourished. No distress.  Psychiatric: He has a normal  mood and affect. His behavior is normal. Judgment and thought content normal.          Assessment & Plan:

## 2013-10-13 NOTE — Assessment & Plan Note (Signed)
D/C seroquel, instead start lunesta.  Keep upcoming apt with psychiatry. Follow up with [place] in 3 months

## 2013-10-13 NOTE — Progress Notes (Signed)
Pre visit review using our clinic review tool, if applicable. No additional management support is needed unless otherwise documented below in the visit note. 

## 2013-10-13 NOTE — Patient Instructions (Signed)
Call if you have difficulty falling asleep on lunesta. Keep upcoming appointment with psychiatry. Follow up in 3 months.

## 2013-10-18 ENCOUNTER — Telehealth: Payer: Self-pay | Admitting: *Deleted

## 2013-10-18 NOTE — Telephone Encounter (Signed)
Received fax from [facility] that authorization has been given for lunesta 2mg  until 05/19/14. Notified pt.

## 2013-10-25 ENCOUNTER — Telehealth: Payer: Self-pay | Admitting: *Deleted

## 2013-10-25 NOTE — Telephone Encounter (Signed)
Received message from pt on Friday that he was seen by [place] on 09/29/13 for a gum infection. He states his insurance is denying coverage ($160) because of "unspecified ental coverage". Pt wants to know if we can resubmit the office visit under "general infection"?

## 2013-10-25 NOTE — Telephone Encounter (Signed)
He did not have any obvious swelling or fever, so I cannot code as infection.  He did have obvious tooth decay. We billed pain due to dental caries (decay).  I am not able to change the diagnosis.

## 2013-10-26 NOTE — Telephone Encounter (Signed)
Left detailed message on pt's cell 

## 2013-11-05 ENCOUNTER — Encounter: Payer: Medicare HMO | Admitting: Family

## 2014-01-21 ENCOUNTER — Encounter: Payer: Medicare HMO | Admitting: Family

## 2014-05-24 ENCOUNTER — Telehealth: Payer: Self-pay

## 2014-05-24 NOTE — Telephone Encounter (Signed)
C/o: Elevated blood pressure (159/96 1 hour ago) and significant bilateral feet, ankle, and leg edema.  Pt states he has gained approximately 30 lbs over the past 6 weeks.  He denies shortness of breath and chest pain.  Currently not on any blood pressure medicine.  Was once on losartan-hydrochlorothiazide, but per patient was "weaned off."  Pt has an appointment with Debbrah Alar, NP tomorrow (05/25/14) at 0900.  However, pt is requesting to be seen today.  Pt states that he works night and would like to be able to rest.   Advise: Pt was advised to go to UC or ER to be seen today.  Pt stated understanding and agreed.  Will follow up with patient in the am.

## 2014-05-25 ENCOUNTER — Ambulatory Visit: Payer: Medicare HMO | Admitting: Family

## 2014-05-25 NOTE — Telephone Encounter (Signed)
Pt was scheduled for apt today in office, however I see that he has cancelled this appointment.  Could you please contact pt to follow up?

## 2014-05-25 NOTE — Telephone Encounter (Signed)
Spoke with patient this morning who expressed that he was doing better. He went to a walk-in clinic yesterday and was treated there.  No needs voiced at this time.

## 2014-07-18 ENCOUNTER — Telehealth: Payer: Self-pay | Admitting: Family

## 2014-07-18 NOTE — Telephone Encounter (Signed)
Caller name: Lavon, [place] Relation to pt: self   Call back number: [phone] Pharmacy: [facility], [place] - [phone] (Phone) [phone] (Fax)         Reason for call:  Pt checking the status of testerone cream, please advise

## 2014-07-18 NOTE — Telephone Encounter (Signed)
Spoke with pt and advised him that last Rx for testosterone cream last sent by PCP on 07/16/13 and med no longer on medication list. He states that he has been taking medication and using refills that he had but refills have now expired. Advised pt that he was due for a follow up in August of 2015 and is past due. Also do not have testosterone level since 06/2013 and advised pt that we usually check level every 6 months. Pt scheduled appt for 07/20/14 at 11am.

## 2014-07-20 ENCOUNTER — Telehealth: Payer: Self-pay | Admitting: Family

## 2014-07-20 ENCOUNTER — Ambulatory Visit (INDEPENDENT_AMBULATORY_CARE_PROVIDER_SITE_OTHER): Payer: Medicare HMO | Admitting: Family

## 2014-07-20 ENCOUNTER — Encounter: Payer: Self-pay | Admitting: Family

## 2014-07-20 VITALS — BP 100/80 | HR 77 | Temp 97.6°F | Resp 16 | Ht 72.0 in | Wt 273.6 lb

## 2014-07-20 DIAGNOSIS — R9431 Abnormal electrocardiogram [ECG] [EKG]: Secondary | ICD-10-CM

## 2014-07-20 DIAGNOSIS — G4700 Insomnia, unspecified: Secondary | ICD-10-CM

## 2014-07-20 DIAGNOSIS — E291 Testicular hypofunction: Secondary | ICD-10-CM

## 2014-07-20 DIAGNOSIS — I10 Essential (primary) hypertension: Secondary | ICD-10-CM

## 2014-07-20 LAB — BASIC METABOLIC PANEL
BUN: 10 mg/dL (ref 6–23)
CO2: 28 meq/L (ref 19–32)
Calcium: 9.4 mg/dL (ref 8.4–10.5)
Chloride: 102 mEq/L (ref 96–112)
Creatinine, Ser: 1.01 mg/dL (ref 0.40–1.50)
GFR: 77.7 mL/min (ref >60.00)
GLUCOSE: 101 mg/dL — AB (ref 70–99)
POTASSIUM: 3.8 meq/L (ref 3.5–5.1)
Sodium: 137 mEq/L (ref 135–145)

## 2014-07-20 LAB — TESTOSTERONE: TESTOSTERONE: 153.37 ng/dL — AB (ref 300.00–890.00)

## 2014-07-20 MED ORDER — LOSARTAN POTASSIUM-HCTZ 50-12.5 MG PO TABS: 1.0000 | ORAL_TABLET | Freq: Every day | ORAL | Status: DC

## 2014-07-20 NOTE — Assessment & Plan Note (Addendum)
BP slightly low today. He will continue to monitor bp at home and I have asked him to contact me if he continues to have low readings for further recommendations. Continue hyzaar.  Obtain bmet.

## 2014-07-20 NOTE — Progress Notes (Signed)
Pre visit review using our clinic review tool, if applicable. No additional management support is needed unless otherwise documented below in the visit note. 

## 2014-07-20 NOTE — Assessment & Plan Note (Signed)
lunesta did not work, now sleeping well on seroquel.

## 2014-07-20 NOTE — Progress Notes (Signed)
Subjective:    Patient ID: Kenneth Watts, male    DOB: 1946/04/12, 69 y.o.   MRN: [id number]  HPI  Mr. Giammarco is a 69 yr old male who presents today for follow up.  Insomnia- lunesta did not work. Now back on seroquel which is helping with sleep.  Hypogonadism- on testosterone cream. Requesting refill of his testosterone cream.   HTN- Patient is currently maintained on the following medications for blood pressure: hyzaar. Patient reports good compliance with blood pressure medications. Patient denies chest pain, shortness of breath or swelling. Last 3 blood pressure readings in our office are as follows: BP Readings from Last 3 Encounters:  07/20/14 100/80  10/13/13 136/86  09/29/13 152/90  reports that his bp was as high as 180/84- off of meds.   Reports normal BP's at home.    Wt Readings from Last 3 Encounters:  07/20/14 273 lb 9.6 oz (124.104 kg)  10/13/13 267 lb 0.6 oz (121.129 kg)  09/29/13 275 lb (124.739 kg)    Reports that his weight has been as high as 294 and he has been dieting.  Not exercising.   OSA- on CPAP.   Abnormal EKG- saw Dr. Elonda Husky and was told follow up needed.    Review of Systems See HPI  Past Medical History  Diagnosis Date  . Hypertension   . Diverticulitis   . Melanoma   . Schizophrenia   . Avascular necrosis of bones of both hips   . OSA (obstructive sleep apnea)     History   Social History  . Marital Status: Single    Spouse Name: N/A  . Number of Children: N/A  . Years of Education: N/A   Occupational History  . Not on file.   Social History Main Topics  . Smoking status: Former Smoker -- 19 years    Quit date: 05/20/1984  . Smokeless tobacco: Never Used  . Alcohol Use: No  . Drug Use: Not on file  . Sexual Activity: Not on file   Other Topics Concern  . Not on file   Social History Narrative   Works midnight shift as a Presenter, broadcasting   1 son in [place]   1 son in [place]- lives with a  significant other   Enjoys working on Teaching laboratory technician, goes to [employer]   Girlfriend has a Architectural technologist in counselor/education    Has worked in the past as a Community education officer in the past.               Past Surgical History  Procedure Laterality Date  . Cholecystectomy    . Melanoma excision Left 10/01/12    arm    Family History  Problem Relation Age of Onset  . Alcohol abuse Mother   . Hypertension Father   . Cancer Paternal Uncle   . Diabetes Neg Hx   . Heart disease Neg Hx     Allergies  Allergen Reactions  . Lisinopril     Cough    Current Outpatient Prescriptions on File Prior to Visit  Medication Sig Dispense Refill  . Multiple Vitamin (MULTIVITAMIN) tablet Take 1 tablet by mouth daily.    . ziprasidone (GEODON) 60 MG capsule Take 120 mg by mouth daily.      No current facility-administered medications on file prior to visit.    BP 100/80 mmHg  Pulse 77  Temp(Src) 97.6 F (36.4 C) (Oral)  Resp 16  Ht  6' (1.829 m)  Wt 273 lb 9.6 oz (124.104 kg)  BMI 37.10 kg/m2  SpO2 97%         Objective:   Physical Exam  Constitutional: He is oriented to person, place, and time. He appears well-developed and well-nourished. No distress.  HENT:  Head: Normocephalic and atraumatic.  Cardiovascular: Normal rate and regular rhythm.   No murmur heard. Pulmonary/Chest: Effort normal and breath sounds normal. No respiratory distress. He has no wheezes. He has no rales.  Musculoskeletal: He exhibits no edema.  Neurological: He is alert and oriented to person, place, and time.  Skin: Skin is warm and dry.  Psychiatric: He has a normal mood and affect. His behavior is normal. Thought content normal.          Assessment & Plan:  Pt given hand written rx for testosterone 5%, 4 clicks once daily, 1 month supply no refills.

## 2014-07-20 NOTE — Patient Instructions (Addendum)
Please complete lab work prior to leaving. Schedule fasting physical at the front desk.

## 2014-07-20 NOTE — Assessment & Plan Note (Signed)
Check serum testosterone, testosterone refill provided.

## 2014-07-20 NOTE — Telephone Encounter (Signed)
Please contact Dr. Valentina Lucks office and request consultation report.

## 2014-07-20 NOTE — Assessment & Plan Note (Signed)
Will request records from Dr. Elonda Husky.

## 2014-07-21 NOTE — Telephone Encounter (Signed)
Called office and requested consultation report.  Awaiting fax.

## 2014-07-25 ENCOUNTER — Telehealth: Payer: Self-pay | Admitting: *Deleted

## 2014-07-25 NOTE — Telephone Encounter (Signed)
Was this fax every received?

## 2014-07-25 NOTE — Telephone Encounter (Signed)
Returning call.

## 2014-07-25 NOTE — Telephone Encounter (Signed)
-----   Message from Debbrah Alar, NP sent at 07/22/2014  1:28 PM EST ----- Sugar is mildly elevated. Please continue to work on healthy diet, exercise and weight loss.  We can check a1c next visit. Testosterone is low. Did he run out of the testosterone prior to this visit or had he missed doses? If not we will need to increase his dose.

## 2014-07-25 NOTE — Telephone Encounter (Signed)
Notified pt and he voices understanding. Pt had run out of testosterone prior to seeing [place]. Advised pt to continue current dose and he voices understanding. Please advise is any further instructions.

## 2014-07-26 NOTE — Telephone Encounter (Signed)
Ive not seen it

## 2014-07-26 NOTE — Telephone Encounter (Signed)
Kenneth Watts-- have you seen this, I do not recall seeing it come through yet?

## 2014-07-26 NOTE — Telephone Encounter (Signed)
Could you please request again?

## 2014-07-26 NOTE — Telephone Encounter (Signed)
Spoke with Dr Valentina Lucks office, [phone] and requested consultation report. Note is from 08/19/13 and they will fax. Janett Billow, please let me know if you see this come through.

## 2014-07-29 NOTE — Telephone Encounter (Signed)
Noted received and forwarded to Provider for review.

## 2014-08-17 ENCOUNTER — Other Ambulatory Visit: Payer: Self-pay | Admitting: *Deleted

## 2014-08-17 MED ORDER — NONFORMULARY OR COMPOUNDED ITEM: Status: DC

## 2014-08-17 NOTE — Telephone Encounter (Signed)
Rx phoned-in to the pharmacy.//AB/CMA

## 2014-11-30 ENCOUNTER — Ambulatory Visit: Payer: Medicare HMO | Admitting: Family

## 2014-12-01 ENCOUNTER — Encounter: Payer: Self-pay | Admitting: Family

## 2014-12-01 ENCOUNTER — Ambulatory Visit (INDEPENDENT_AMBULATORY_CARE_PROVIDER_SITE_OTHER): Payer: Medicare HMO | Admitting: Family

## 2014-12-01 VITALS — BP 134/84 | HR 67 | Temp 97.6°F | Resp 16 | Ht 72.0 in | Wt 269.2 lb

## 2014-12-01 DIAGNOSIS — B2790 Infectious mononucleosis, unspecified without complication: Secondary | ICD-10-CM

## 2014-12-01 DIAGNOSIS — K029 Dental caries, unspecified: Secondary | ICD-10-CM

## 2014-12-01 DIAGNOSIS — R5382 Chronic fatigue, unspecified: Secondary | ICD-10-CM

## 2014-12-01 DIAGNOSIS — E785 Hyperlipidemia, unspecified: Secondary | ICD-10-CM | POA: Diagnosis not present

## 2014-12-01 DIAGNOSIS — E291 Testicular hypofunction: Secondary | ICD-10-CM

## 2014-12-01 DIAGNOSIS — E559 Vitamin D deficiency, unspecified: Secondary | ICD-10-CM | POA: Diagnosis not present

## 2014-12-01 DIAGNOSIS — F209 Schizophrenia, unspecified: Secondary | ICD-10-CM

## 2014-12-01 LAB — URINALYSIS, ROUTINE W REFLEX MICROSCOPIC
Bilirubin Urine: NEGATIVE
HGB URINE DIPSTICK: NEGATIVE
KETONES UR: NEGATIVE
Leukocytes, UA: NEGATIVE
Nitrite: NEGATIVE
PH: 5.5 (ref 5.0–8.0)
RBC / HPF: NONE SEEN (ref 0–?)
Specific Gravity, Urine: >=1.03 — AB (ref 1.000–1.030)
TOTAL PROTEIN, URINE-UPE24: NEGATIVE
URINE GLUCOSE: NEGATIVE
Urobilinogen, UA: 0.2 (ref 0.0–1.0)

## 2014-12-01 LAB — HEPATIC FUNCTION PANEL
ALK PHOS: 58 U/L (ref 39–117)
ALT: 14 U/L (ref 0–53)
AST: 17 U/L (ref 0–37)
Albumin: 4 g/dL (ref 3.5–5.2)
Bilirubin, Direct: 0.1 mg/dL (ref 0.0–0.3)
Total Bilirubin: 0.3 mg/dL (ref 0.2–1.2)
Total Protein: 7 g/dL (ref 6.0–8.3)

## 2014-12-01 LAB — LIPID PANEL
Cholesterol: 139 mg/dL (ref 0–200)
HDL: 54.2 mg/dL (ref >39.00)
LDL CALC: 66 mg/dL (ref 0–99)
NonHDL: 84.8
Total CHOL/HDL Ratio: 3
Triglycerides: 96 mg/dL (ref 0.0–149.0)
VLDL: 19.2 mg/dL (ref 0.0–40.0)

## 2014-12-01 LAB — CBC WITH DIFFERENTIAL/PLATELET
Basophils Absolute: 0 10*3/uL (ref 0.0–0.1)
Basophils Relative: 0.8 % (ref 0.0–3.0)
Eosinophils Absolute: 0.2 10*3/uL (ref 0.0–0.7)
Eosinophils Relative: 3.6 % (ref 0.0–5.0)
HEMATOCRIT: 44.8 % (ref 39.0–52.0)
Hemoglobin: 14.8 g/dL (ref 13.0–17.0)
Lymphocytes Relative: 36.9 % (ref 12.0–46.0)
Lymphs Abs: 2.2 10*3/uL (ref 0.7–4.0)
MCHC: 33 g/dL (ref 30.0–36.0)
MCV: 87.4 fl (ref 78.0–100.0)
MONO ABS: 0.5 10*3/uL (ref 0.1–1.0)
Monocytes Relative: 7.7 % (ref 3.0–12.0)
NEUTROS PCT: 51 % (ref 43.0–77.0)
Neutro Abs: 3 10*3/uL (ref 1.4–7.7)
PLATELETS: 177 10*3/uL (ref 150.0–400.0)
RBC: 5.13 Mil/uL (ref 4.22–5.81)
RDW: 14.4 % (ref 11.5–15.5)
WBC: 5.8 10*3/uL (ref 4.0–10.5)

## 2014-12-01 LAB — BASIC METABOLIC PANEL
BUN: 17 mg/dL (ref 6–23)
CO2: 30 mEq/L (ref 19–32)
CREATININE: 0.93 mg/dL (ref 0.40–1.50)
Calcium: 9 mg/dL (ref 8.4–10.5)
Chloride: 105 mEq/L (ref 96–112)
GFR: 85.37 mL/min (ref >60.00)
Glucose, Bld: 79 mg/dL (ref 70–99)
Potassium: 3.7 mEq/L (ref 3.5–5.1)
SODIUM: 141 meq/L (ref 135–145)

## 2014-12-01 LAB — TESTOSTERONE: Testosterone: 265.98 ng/dL — ABNORMAL LOW (ref 300.00–890.00)

## 2014-12-01 LAB — TSH: TSH: 2.99 u[IU]/mL (ref 0.35–4.50)

## 2014-12-01 LAB — VITAMIN D 25 HYDROXY (VIT D DEFICIENCY, FRACTURES): VITD: 21.82 ng/mL — ABNORMAL LOW (ref 30.00–100.00)

## 2014-12-01 NOTE — Progress Notes (Signed)
Pre visit review using our clinic review tool, if applicable. No additional management support is needed unless otherwise documented below in the visit note.,h  

## 2014-12-01 NOTE — Progress Notes (Signed)
Subjective:    Patient ID: Kenneth Watts, male    DOB: 09/01/1945, 69 y.o.   MRN: [id number]  HPI   Kenneth Watts is a 69 yr old male who presents today with complaint of weakness and fatigue.  Symptoms have been present x 5 weeks. Pt reports that in Mid May- he developed weakness/nausea/vomitting after eating a lean Cuisine.  Felt weak x 1 week, then it resolved.  Reports that around 5/31 he had a dental abscess and saw dentist on 7/6 and was given rx for amoxicillin.  Symptoms improved a few days into treatment.  However then he developed another abscess on the contralateral side and took another round of abx.  He was given rx for clinda last week.  He has 5 more days.  Reports that he continues to feel weak/tired.  Left gum still infected with pus.     HTN- Stopped BP med.  BP Readings from Last 3 Encounters:  12/01/14 134/84  07/20/14 100/80  10/13/13 136/86   Schizophrenia- no longer taking geodon because of fear of arythmia.  Denies mood issue. He sees psych today.     Review of Systems See HPI  Past Medical History  Diagnosis Date  . Hypertension   . Diverticulitis   . Melanoma   . Schizophrenia   . Avascular necrosis of bones of both hips   . OSA (obstructive sleep apnea)     History   Social History  . Marital Status: Single    Spouse Name: N/A  . Number of Children: N/A  . Years of Education: N/A   Occupational History  . Not on file.   Social History Main Topics  . Smoking status: Former Smoker -- 19 years    Quit date: 05/20/1984  . Smokeless tobacco: Never Used  . Alcohol Use: No  . Drug Use: Not on file  . Sexual Activity: Not on file   Other Topics Concern  . Not on file   Social History Narrative   Works midnight shift as a Presenter, broadcasting   1 son in [place]   1 son in [place]- lives with a significant other   Enjoys working on Teaching laboratory technician, goes to [employer]   Girlfriend has a Architectural technologist in counselor/education    Has worked in the past as a Community education officer in the past.               Past Surgical History  Procedure Laterality Date  . Cholecystectomy    . Melanoma excision Left 10/01/12    arm    Family History  Problem Relation Age of Onset  . Alcohol abuse Mother   . Hypertension Father   . Cancer Paternal Uncle   . Diabetes Neg Hx   . Heart disease Neg Hx     Allergies  Allergen Reactions  . Lisinopril     Cough    Current Outpatient Prescriptions on File Prior to Visit  Medication Sig Dispense Refill  . fexofenadine (ALLEGRA) 180 MG tablet Take 180 mg by mouth daily.    Marland Kitchen losartan-hydrochlorothiazide (HYZAAR) 50-12.5 MG per tablet Take 1 tablet by mouth daily. 90 tablet 1  . Multiple Vitamin (MULTIVITAMIN) tablet Take 1 tablet by mouth daily.    . NONFORMULARY OR COMPOUNDED ITEM Testosterone 5% HRT (MEN)-Apply 4 clicks from dispenser once daily. 30 each 4  . QUEtiapine (SEROQUEL) 100 MG tablet Take 100 mg by mouth at bedtime.    Marland Kitchen  ziprasidone (GEODON) 60 MG capsule Take 120 mg by mouth daily.      No current facility-administered medications on file prior to visit.    Ht 6' (1.829 m)  Wt 269 lb 3.2 oz (122.108 kg)  BMI 36.50 kg/m2       Objective:   Physical Exam  Constitutional: He is oriented to person, place, and time. He appears well-developed and well-nourished. No distress.  HENT:  Head: Normocephalic and atraumatic.  Right Ear: Tympanic membrane and ear canal normal.  Left Ear: Tympanic membrane and ear canal normal.  Mouth/Throat: No oropharyngeal exudate, posterior oropharyngeal edema or posterior oropharyngeal erythema.  Mild gingival swelling around upper rear molar  Cardiovascular: Normal rate and regular rhythm.   No murmur heard. Pulmonary/Chest: Effort normal and breath sounds normal. No respiratory distress. He has no wheezes. He has no rales.  Musculoskeletal: He exhibits no edema.  Lymphadenopathy:    He has no cervical adenopathy.    Neurological: He is alert and oriented to person, place, and time.  Skin: Skin is warm and dry.  Psychiatric: He has a normal mood and affect. His behavior is normal. Thought content normal.          Assessment & Plan:  Declines HIV screening.

## 2014-12-01 NOTE — Patient Instructions (Signed)
Please complete lab work prior to leaving. Follow up with dentist if gum pain/swelling is not resolved by the time you complete clindamycin. Follow up in 1 month.

## 2014-12-02 ENCOUNTER — Telehealth: Payer: Self-pay | Admitting: Family

## 2014-12-02 DIAGNOSIS — B2790 Infectious mononucleosis, unspecified without complication: Secondary | ICD-10-CM | POA: Insufficient documentation

## 2014-12-02 DIAGNOSIS — E559 Vitamin D deficiency, unspecified: Secondary | ICD-10-CM | POA: Insufficient documentation

## 2014-12-02 LAB — EPSTEIN-BARR VIRUS VCA ANTIBODY PANEL
EBV EA IgG: <5 U/mL (ref <9.0)
EBV NA IgG: >600 U/mL — ABNORMAL HIGH (ref <18.0)
EBV VCA IGM: 96 U/mL — AB (ref <36.0)
EBV VCA IgG: 284 U/mL — ABNORMAL HIGH (ref <18.0)

## 2014-12-02 MED ORDER — VITAMIN D (ERGOCALCIFEROL) 1.25 MG (50000 UNIT) PO CAPS: 50000.0000 [IU] | ORAL_CAPSULE | ORAL | Status: DC

## 2014-12-02 NOTE — Assessment & Plan Note (Signed)
New. EBV panel consistent with recent infection, advise supportive measures.  See phone note.

## 2014-12-02 NOTE — Assessment & Plan Note (Signed)
Appears stable off of geodon, management per psych.

## 2014-12-02 NOTE — Assessment & Plan Note (Signed)
Dental abscess. Advised pt to complete clindamycin and follow up with dentist.

## 2014-12-02 NOTE — Telephone Encounter (Signed)
Notified patient of labwork and medications.  He stated understanding and agreed.  He would not like to schedule a follow-up at this time, states he will call back office to schedule.

## 2014-12-02 NOTE — Assessment & Plan Note (Signed)
Testosterone low, increase from 4 to 5 clicks/day.

## 2014-12-02 NOTE — Assessment & Plan Note (Signed)
Vit d low add weekly supplement.

## 2014-12-02 NOTE — Telephone Encounter (Signed)
Lab work shows vit D deficiency.  Vitamin D level is low.  Advise patient to begin vit D 50000 units once weekly for 12 weeks, then repeat vit D level (dx Vit D deficiency).  rx [facility]. Testosterone level is low.  Increase testosterone to 5 clicks once daily.  Lab work shows that he may be getting over mono infection- not much to do for this other rest and time.   I would like to see him back in 1 month.

## 2014-12-03 LAB — URINE CULTURE
Colony Count: NO GROWTH
Organism ID, Bacteria: NO GROWTH

## 2015-01-02 ENCOUNTER — Telehealth: Payer: Self-pay | Admitting: Family

## 2015-01-02 NOTE — Telephone Encounter (Signed)
Left message to check my chart.

## 2015-01-02 NOTE — Telephone Encounter (Signed)
Pt is wanting recommendation for a good psychiatrist in the [place] area. Pt wants someone <70 years old. [place], [place], or [place] is fine.  Pt is requesting a call.

## 2015-01-05 ENCOUNTER — Ambulatory Visit (INDEPENDENT_AMBULATORY_CARE_PROVIDER_SITE_OTHER): Payer: Medicare HMO | Admitting: Family Medicine

## 2015-01-05 ENCOUNTER — Encounter: Payer: Self-pay | Admitting: Family Medicine

## 2015-01-05 ENCOUNTER — Telehealth: Payer: Self-pay | Admitting: Family

## 2015-01-05 VITALS — BP 122/72 | HR 108 | Temp 98.3°F | Wt 265.0 lb

## 2015-01-05 DIAGNOSIS — R1013 Epigastric pain: Secondary | ICD-10-CM

## 2015-01-05 DIAGNOSIS — C4490 Unspecified malignant neoplasm of skin, unspecified: Secondary | ICD-10-CM | POA: Insufficient documentation

## 2015-01-05 MED ORDER — GI COCKTAIL ~~LOC~~
30.0000 mL | Freq: Once | ORAL | Status: AC
  Administered 2015-01-05: 30 mL via ORAL

## 2015-01-05 MED ORDER — OMEPRAZOLE 40 MG PO CPDR: 40.0000 mg | DELAYED_RELEASE_CAPSULE | Freq: Every day | ORAL | Status: DC

## 2015-01-05 NOTE — Patient Instructions (Signed)
Gastroesophageal Reflux Disease, Adult Gastroesophageal reflux disease (GERD) happens when acid from your stomach flows up into the esophagus. When acid comes in contact with the esophagus, the acid causes soreness (inflammation) in the esophagus. Over time, GERD may create small holes (ulcers) in the lining of the esophagus. CAUSES   Increased body weight. This puts pressure on the stomach, making acid rise from the stomach into the esophagus.  Smoking. This increases acid production in the stomach.  Drinking alcohol. This causes decreased pressure in the lower esophageal sphincter (valve or ring of muscle between the esophagus and stomach), allowing acid from the stomach into the esophagus.  Late evening meals and a full stomach. This increases pressure and acid production in the stomach.  A malformed lower esophageal sphincter. Sometimes, no cause is found. SYMPTOMS   Burning pain in the lower part of the mid-chest behind the breastbone and in the mid-stomach area. This may occur twice a week or more often.  Trouble swallowing.  Sore throat.  Dry cough.  Asthma-like symptoms including chest tightness, shortness of breath, or wheezing. DIAGNOSIS  Your caregiver may be able to diagnose GERD based on your symptoms. In some cases, X-rays and other tests may be done to check for complications or to check the condition of your stomach and esophagus. TREATMENT  Your caregiver may recommend over-the-counter or prescription medicines to help decrease acid production. Ask your caregiver before starting or adding any new medicines.  HOME CARE INSTRUCTIONS   Change the factors that you can control. Ask your caregiver for guidance concerning weight loss, quitting smoking, and alcohol consumption.  Avoid foods and drinks that make your symptoms worse, such as:  Caffeine or alcoholic drinks.  Chocolate.  Peppermint or mint flavorings.  Garlic and onions.  Spicy foods.  Citrus fruits,  such as oranges, lemons, or limes.  Tomato-based foods such as sauce, chili, salsa, and pizza.  Fried and fatty foods.  Avoid lying down for the 3 hours prior to your bedtime or prior to taking a nap.  Eat small, frequent meals instead of large meals.  Wear loose-fitting clothing. Do not wear anything tight around your waist that causes pressure on your stomach.  Raise the head of your bed 6 to 8 inches with wood blocks to help you sleep. Extra pillows will not help.  Only take over-the-counter or prescription medicines for pain, discomfort, or fever as directed by your caregiver.  Do not take aspirin, ibuprofen, or other nonsteroidal anti-inflammatory drugs (NSAIDs). SEEK IMMEDIATE MEDICAL CARE IF:   You have pain in your arms, neck, jaw, teeth, or back.  Your pain increases or changes in intensity or duration.  You develop nausea, vomiting, or sweating (diaphoresis).  You develop shortness of breath, or you faint.  Your vomit is green, yellow, black, or looks like coffee grounds or blood.  Your stool is red, bloody, or black. These symptoms could be signs of other problems, such as heart disease, gastric bleeding, or esophageal bleeding. MAKE SURE YOU:   Understand these instructions.  Will watch your condition.  Will get help right away if you are not doing well or get worse. Document Released: 02/13/2005 Document Revised: 07/29/2011 Document Reviewed: 11/23/2010 ExitCare Patient Information 2015 [employer]. This information is not intended to replace advice given to you by your health care provider. Make sure you discuss any questions you have with your health care provider.  

## 2015-01-05 NOTE — Assessment & Plan Note (Signed)
Omeprazole qd Drink plenty of water If symptoms do not improve-- refer to GI

## 2015-01-05 NOTE — Telephone Encounter (Signed)
[facility] - Client TELEPHONE ADVICE RECORD [facility] Patient Name: Kenneth Watts DOB: 01-16-1946 Initial Comment Caller states he has a possible vitamin stuck in his esophagus. Nurse Assessment Nurse: Mechele Dawley, RN, Amy Date/Time Eilene Ghazi Time): 01/05/2015 3:30:50 PM Confirm and document reason for call. If symptomatic, describe symptoms. ---CALLER STATES THAT THIS HAPPENED 3-4 DAYS AGO. HE SWALLOWED A VITAMINS THAT DAY. HE FEELS AS IF IT IS STUCK IN HIS ESOPHAGUS. NO PAIN. HE IS SWALLOWING WITHOUT DIFFICULTY. HE HAS NOT BEEN SEEN ANYWHERE FOR THIS. NO FEVER. NO COUGH. NO SOB. Has the patient traveled out of the country within the last 30 days? ---Not Applicable Does the patient require triage? ---Yes Related visit to physician within the last 2 weeks? ---Yes Does the PT have any chronic conditions? (i.e. diabetes, asthma, etc.) ---Yes List chronic conditions. ---HIGH BP Guidelines Guideline Title Affirmed Question Affirmed Notes Choking - Inhaled Foreign Body Recovered from choking (all triage questions negative) Final Disposition User See Physician within 4 Hours (or PCP triage) [place], RN, Amy Comments CALLER IS PRETTY DIFFICULT TO TRIAGE AS HE KEEPS SAYING ITS IN HIS ESOPHAGUS AND HE IS NOT HAVING ANY DIFFICULTIES. HE DOES GO ON TO STATE THAT HE CAN STILL FEEL LIKE ITS IN THERE. DENIES ANY PROBLEMS. HE DOES NOT WANT TO GO INTO THE ED. HE DID AGREE TO HAVE A VISIT WITH THE MD AND SEE WHAT THEY CAN RECOMMEND. DID INSTRUCT HIM THAT THE MD CAN ASSESS AND TELL HIM WHAT HE NEEDS TO HAVE DONE. HE STATE HE WILL GO TO THE MD OFFICE, BUT WILL NOT GO TO THE ED. OUT COME WAS TO BE SEEN AT THE MD OFFICE WITHIN THE NEXT 4 HOURS. DURING TRIAGE DID ASK HIM IF IS HAVING DIFFICULTY SWALLOWING AND HE DOES STATE THAT HE DID THIS 4 DAYS AGO AND HAS EATEN AND BEEN DRINK SINCE WITHOUT DIFFICULTY. Referrals REFERRED TO PCP OFFICE PLEASE NOTE:

## 2015-01-05 NOTE — Telephone Encounter (Signed)
Called patient at [phone] [facility]) and left message to return call.

## 2015-01-05 NOTE — Progress Notes (Signed)
Pre visit review using our clinic review tool, if applicable. No additional management support is needed unless otherwise documented below in the visit note. 

## 2015-01-05 NOTE — Progress Notes (Signed)
Patient ID: Kenneth Watts, male   DOB: Sep 24, 1945, 69 y.o.   MRN: [id number]   Subjective:    Patient ID: Kenneth Watts, male    DOB: [birth year], 69 y.o.   MRN: [id number]  Chief Complaint  Patient presents with  . Swallowed Foreign Body    patient swallowed a MVI and he ffels like it is stuck in his esophagus x's 3 days    HPI Patient is in today with c/o the feeling that a vitamin is stuck in his throat.  He states he is not drinking a lot of fluids.  No trouble breathing.  No Nausea or vomiting.  He has had this feeling for 2-3 days.    Past Medical History  Diagnosis Date  . Hypertension   . Diverticulitis   . Melanoma   . Schizophrenia   . Avascular necrosis of bones of both hips   . OSA (obstructive sleep apnea)     Past Surgical History  Procedure Laterality Date  . Cholecystectomy    . Melanoma excision Left 10/01/12    arm    Family History  Problem Relation Age of Onset  . Alcohol abuse Mother   . Hypertension Father   . Cancer Paternal Uncle   . Diabetes Neg Hx   . Heart disease Neg Hx     Social History   Social History  . Marital Status: Single    Spouse Name: N/A  . Number of Children: N/A  . Years of Education: N/A   Occupational History  . Not on file.   Social History Main Topics  . Smoking status: Former Smoker -- 19 years    Quit date: 05/20/1984  . Smokeless tobacco: Never Used  . Alcohol Use: No  . Drug Use: Not on file  . Sexual Activity: Not on file   Other Topics Concern  . Not on file   Social History Narrative   Works midnight shift as a Presenter, broadcasting   1 son in [place]   1 son in [place]- lives with a significant other   Enjoys working on Teaching laboratory technician, goes to [employer]   Girlfriend has a Architectural technologist in counselor/education    Has worked in the past as a Community education officer in the past.               Outpatient Prescriptions Prior to Visit  Medication Sig Dispense Refill  . fexofenadine  (ALLEGRA) 180 MG tablet Take 180 mg by mouth daily.    . Multiple Vitamin (MULTIVITAMIN) tablet Take 1 tablet by mouth daily.    . NONFORMULARY OR COMPOUNDED ITEM Testosterone 5% HRT (MEN)-Apply 4 clicks from dispenser once daily. (Patient taking differently: Testosterone 5% HRT (MEN)-Apply 5 clicks from dispenser once daily.) 30 each 4  . QUEtiapine (SEROQUEL) 100 MG tablet Take 50 mg by mouth at bedtime.    . Vitamin D, Ergocalciferol, (DRISDOL) 50000 UNITS CAPS capsule Take 1 capsule (50,000 Units total) by mouth every 7 (seven) days. 12 capsule 0   No facility-administered medications prior to visit.    Allergies  Allergen Reactions  . Lisinopril     Cough    Review of Systems  Constitutional: Negative for fever and malaise/fatigue.  HENT: Negative for congestion.   Eyes: Negative for discharge.  Respiratory: Negative for shortness of breath.   Cardiovascular: Negative for chest pain, palpitations and leg swelling.  Gastrointestinal: Positive for heartburn. Negative for nausea, vomiting, abdominal pain,  constipation and blood in stool.  Genitourinary: Negative for dysuria.  Musculoskeletal: Negative for falls.  Skin: Negative for rash.  Neurological: Negative for loss of consciousness and headaches.  Endo/Heme/Allergies: Negative for environmental allergies.  Psychiatric/Behavioral: Negative for depression. The patient is not nervous/anxious.        Objective:    Physical Exam  Constitutional: He is oriented to person, place, and time. Vital signs are normal. He appears well-developed and well-nourished. He is sleeping.  HENT:  Head: Normocephalic and atraumatic.  Mouth/Throat: Oropharynx is clear and moist.  Eyes: EOM are normal. Pupils are equal, round, and reactive to light.  Neck: Normal range of motion. Neck supple. No thyromegaly present.  Cardiovascular: Normal rate and regular rhythm.   No murmur heard. Pulmonary/Chest: Effort normal and breath sounds normal. No  respiratory distress. He has no wheezes. He has no rales. He exhibits no tenderness.  Abdominal: Soft. He exhibits no mass. There is no tenderness. There is no rebound and no guarding.  Musculoskeletal: He exhibits no edema or tenderness.  Neurological: He is alert and oriented to person, place, and time.  Skin: Skin is warm and dry.  Psychiatric: He has a normal mood and affect. His behavior is normal. Judgment and thought content normal.  Nursing note and vitals reviewed.   BP 122/72 mmHg  Pulse 108  Temp(Src) 98.3 F (36.8 C) (Oral)  Wt 265 lb (120.203 kg)  SpO2 97% Wt Readings from Last 3 Encounters:  01/05/15 265 lb (120.203 kg)  12/01/14 269 lb 3.2 oz (122.108 kg)  07/20/14 273 lb 9.6 oz (124.104 kg)     Lab Results  Component Value Date   WBC 5.8 12/01/2014   HGB 14.8 12/01/2014   HCT 44.8 12/01/2014   PLT 177.0 12/01/2014   GLUCOSE 79 12/01/2014   CHOL 139 12/01/2014   TRIG 96.0 12/01/2014   HDL 54.20 12/01/2014   LDLCALC 66 12/01/2014   ALT 14 12/01/2014   AST 17 12/01/2014   NA 141 12/01/2014   K 3.7 12/01/2014   CL 105 12/01/2014   CREATININE 0.93 12/01/2014   BUN 17 12/01/2014   CO2 30 12/01/2014   TSH 2.99 12/01/2014   PSA 1.88 07/16/2013    Lab Results  Component Value Date   TSH 2.99 12/01/2014   Lab Results  Component Value Date   WBC 5.8 12/01/2014   HGB 14.8 12/01/2014   HCT 44.8 12/01/2014   MCV 87.4 12/01/2014   PLT 177.0 12/01/2014   Lab Results  Component Value Date   NA 141 12/01/2014   K 3.7 12/01/2014   CO2 30 12/01/2014   GLUCOSE 79 12/01/2014   BUN 17 12/01/2014   CREATININE 0.93 12/01/2014   BILITOT 0.3 12/01/2014   ALKPHOS 58 12/01/2014   AST 17 12/01/2014   ALT 14 12/01/2014   PROT 7.0 12/01/2014   ALBUMIN 4.0 12/01/2014   CALCIUM 9.0 12/01/2014   GFR 85.37 12/01/2014   Lab Results  Component Value Date   CHOL 139 12/01/2014   Lab Results  Component Value Date   HDL 54.20 12/01/2014   Lab Results    Component Value Date   LDLCALC 66 12/01/2014   Lab Results  Component Value Date   TRIG 96.0 12/01/2014   Lab Results  Component Value Date   CHOLHDL 3 12/01/2014   No results found for: HGBA1C     Assessment & Plan:   Problem List Items Addressed This Visit    None  Visit Diagnoses    Dyspepsia    -  Primary    Relevant Medications    gi cocktail (Maalox,Lidocaine,Donnatal) (Completed)    omeprazole (PRILOSEC) 40 MG capsule       I am having Mr. Sudduth start on omeprazole. I am also having him maintain his multivitamin, QUEtiapine, fexofenadine, NONFORMULARY OR COMPOUNDED ITEM, Vitamin D (Ergocalciferol), MILK THISTLE PO, and Coenzyme Q10 (CO Q 10 PO). We administered gi cocktail.  Meds ordered this encounter  Medications  . MILK THISTLE PO    Sig: Take by mouth.  . Coenzyme Q10 (CO Q 10 PO)    Sig: Take by mouth.  . gi cocktail (Maalox,Lidocaine,Donnatal)    Sig:   . omeprazole (PRILOSEC) 40 MG capsule    Sig: Take 1 capsule (40 mg total) by mouth daily.    Dispense:  30 capsule    Refill:  [place], DO

## 2015-01-24 ENCOUNTER — Encounter: Payer: Self-pay | Admitting: Family

## 2015-01-24 ENCOUNTER — Ambulatory Visit (INDEPENDENT_AMBULATORY_CARE_PROVIDER_SITE_OTHER): Payer: Medicare HMO | Admitting: Family

## 2015-01-24 VITALS — BP 130/84 | HR 74 | Temp 97.9°F | Resp 18 | Ht 72.0 in | Wt 274.0 lb

## 2015-01-24 DIAGNOSIS — E559 Vitamin D deficiency, unspecified: Secondary | ICD-10-CM

## 2015-01-24 DIAGNOSIS — I10 Essential (primary) hypertension: Secondary | ICD-10-CM

## 2015-01-24 DIAGNOSIS — Z23 Encounter for immunization: Secondary | ICD-10-CM | POA: Diagnosis not present

## 2015-01-24 DIAGNOSIS — E291 Testicular hypofunction: Secondary | ICD-10-CM

## 2015-01-24 DIAGNOSIS — G4733 Obstructive sleep apnea (adult) (pediatric): Secondary | ICD-10-CM

## 2015-01-24 DIAGNOSIS — R5383 Other fatigue: Secondary | ICD-10-CM | POA: Diagnosis not present

## 2015-01-24 DIAGNOSIS — B2790 Infectious mononucleosis, unspecified without complication: Secondary | ICD-10-CM

## 2015-01-24 LAB — COMPREHENSIVE METABOLIC PANEL
ALT: 13 U/L (ref 0–53)
AST: 17 U/L (ref 0–37)
Albumin: 3.9 g/dL (ref 3.5–5.2)
Alkaline Phosphatase: 61 U/L (ref 39–117)
BUN: 12 mg/dL (ref 6–23)
CHLORIDE: 105 meq/L (ref 96–112)
CO2: 30 meq/L (ref 19–32)
Calcium: 9 mg/dL (ref 8.4–10.5)
Creatinine, Ser: 0.89 mg/dL (ref 0.40–1.50)
GFR: 89.78 mL/min (ref >60.00)
GLUCOSE: 101 mg/dL — AB (ref 70–99)
POTASSIUM: 3.4 meq/L — AB (ref 3.5–5.1)
SODIUM: 142 meq/L (ref 135–145)
Total Bilirubin: 0.4 mg/dL (ref 0.2–1.2)
Total Protein: 6.8 g/dL (ref 6.0–8.3)

## 2015-01-24 LAB — HEPATITIS B SURFACE ANTIBODY,QUALITATIVE: Hep B S Ab: NEGATIVE

## 2015-01-24 LAB — HEPATITIS C ANTIBODY: HCV AB: NEGATIVE

## 2015-01-24 LAB — CBC WITH DIFFERENTIAL/PLATELET
BASOS PCT: 0.7 % (ref 0.0–3.0)
Basophils Absolute: 0 10*3/uL (ref 0.0–0.1)
EOS PCT: 3.4 % (ref 0.0–5.0)
Eosinophils Absolute: 0.2 10*3/uL (ref 0.0–0.7)
HCT: 42.2 % (ref 39.0–52.0)
Hemoglobin: 14.1 g/dL (ref 13.0–17.0)
LYMPHS ABS: 1.7 10*3/uL (ref 0.7–4.0)
Lymphocytes Relative: 26.9 % (ref 12.0–46.0)
MCHC: 33.3 g/dL (ref 30.0–36.0)
MCV: 86.1 fl (ref 78.0–100.0)
MONO ABS: 0.4 10*3/uL (ref 0.1–1.0)
MONOS PCT: 6.3 % (ref 3.0–12.0)
NEUTROS PCT: 62.7 % (ref 43.0–77.0)
Neutro Abs: 4 10*3/uL (ref 1.4–7.7)
Platelets: 174 10*3/uL (ref 150.0–400.0)
RBC: 4.91 Mil/uL (ref 4.22–5.81)
RDW: 14 % (ref 11.5–15.5)
WBC: 6.4 10*3/uL (ref 4.0–10.5)

## 2015-01-24 LAB — TESTOSTERONE: TESTOSTERONE: 190.02 ng/dL — AB (ref 300.00–890.00)

## 2015-01-24 LAB — HEPATITIS B SURFACE ANTIGEN: Hepatitis B Surface Ag: NEGATIVE

## 2015-01-24 MED ORDER — LOSARTAN POTASSIUM-HCTZ 50-12.5 MG PO TABS: 1.0000 | ORAL_TABLET | Freq: Every day | ORAL | Status: DC

## 2015-01-24 NOTE — Assessment & Plan Note (Signed)
Pt with ongoing fatigue.  Most likely due to mono.  I do not believe he has FM.  Will repeat EBV panel and also screen for RMSF, HIV, Hep B and C, lyme disease.

## 2015-01-24 NOTE — Assessment & Plan Note (Deleted)
BP stable on losartan-hctz, continue same.

## 2015-01-24 NOTE — Assessment & Plan Note (Signed)
  Hypogonadism- recheck testosterone level.

## 2015-01-24 NOTE — Patient Instructions (Signed)
Please complete lab work prior to leaving. Follow up in 6 weeks.  

## 2015-01-24 NOTE — Progress Notes (Signed)
Pre visit review using our clinic review tool, if applicable. No additional management support is needed unless otherwise documented below in the visit note. 

## 2015-01-24 NOTE — Assessment & Plan Note (Signed)
Continue vitamin D supplement

## 2015-01-24 NOTE — Assessment & Plan Note (Signed)
BP stable on losartan-hctz, continue same.

## 2015-01-24 NOTE — Progress Notes (Signed)
Subjective:    Patient ID: Kenneth Watts, male    DOB: Jan 07, 1946, 69 y.o.   MRN: [id number]  HPI  Kenneth Watts is a 69 yr old male who presents today for follow up. He was seen back in July with chief complaint of fatigue and weakness which had been present x 5 weeks at that time.  He had recently been treated for a dental abscess.   GERD- he saw Dr. Etter Sjogren back in August and was placed on omeprazole. Reports that he took rx for 3-4 days and symptoms resolved.    HTN-  BP Readings from Last 3 Encounters:  01/24/15 130/84  01/05/15 122/72  12/01/14 134/84   Schizophrenia- last visit the pt had stopped geodon due to fear of arrythmia side effect.   Hypogonadism- last visit testosterone level was noted to be low. His testosterone level was increased.  Mono- EBV panel last visit was consistent with recent infection.  He was advised on supportive measures.  He is requesting to rule out hepatitis.  Denies myalgia.    Vit D deficiency- pt was placed on vit D supplement.    OSA- reports good compliance with his CPAP.    Review of Systems  Constitutional: Negative for fever.  Respiratory: Negative for cough.    See HPI  Past Medical History  Diagnosis Date  . Hypertension   . Diverticulitis   . Melanoma   . Schizophrenia   . Avascular necrosis of bones of both hips   . OSA (obstructive sleep apnea)     Social History   Social History  . Marital Status: Single    Spouse Name: N/A  . Number of Children: N/A  . Years of Education: N/A   Occupational History  . Not on file.   Social History Main Topics  . Smoking status: Former Smoker -- 19 years    Quit date: 05/20/1984  . Smokeless tobacco: Never Used  . Alcohol Use: No  . Drug Use: Not on file  . Sexual Activity: Not on file   Other Topics Concern  . Not on file   Social History Narrative   Works midnight shift as a Presenter, broadcasting   1 son in [place]   1 son in [place]- lives with a  significant other   Enjoys working on Teaching laboratory technician, goes to [employer]   Girlfriend has a Architectural technologist in counselor/education    Has worked in the past as a Community education officer in the past.               Past Surgical History  Procedure Laterality Date  . Cholecystectomy    . Melanoma excision Left 10/01/12    arm    Family History  Problem Relation Age of Onset  . Alcohol abuse Mother   . Hypertension Father   . Cancer Paternal Uncle   . Diabetes Neg Hx   . Heart disease Neg Hx     Allergies  Allergen Reactions  . Lisinopril     Cough    Current Outpatient Prescriptions on File Prior to Visit  Medication Sig Dispense Refill  . Coenzyme Q10 (CO Q 10 PO) Take by mouth.    . fexofenadine (ALLEGRA) 180 MG tablet Take 180 mg by mouth daily.    Marland Kitchen MILK THISTLE PO Take by mouth.    . Multiple Vitamin (MULTIVITAMIN) tablet Take 1 tablet by mouth daily.    . NONFORMULARY OR COMPOUNDED  ITEM Testosterone 5% HRT (MEN)-Apply 4 clicks from dispenser once daily. (Patient taking differently: Testosterone 5% HRT (MEN)-Apply 5 clicks from dispenser once daily.) 30 each 4  . QUEtiapine (SEROQUEL) 100 MG tablet Take 50 mg by mouth at bedtime.    . Vitamin D, Ergocalciferol, (DRISDOL) 50000 UNITS CAPS capsule Take 1 capsule (50,000 Units total) by mouth every 7 (seven) days. 12 capsule 0   No current facility-administered medications on file prior to visit.    BP 130/84 mmHg  Pulse 74  Temp(Src) 97.9 F (36.6 C) (Oral)  Resp 18  Ht 6' (1.829 m)  Wt 274 lb (124.286 kg)  BMI 37.15 kg/m2  SpO2 96%       Objective:   Physical Exam  Constitutional: He is oriented to person, place, and time. He appears well-developed and well-nourished. No distress.  HENT:  Head: Normocephalic and atraumatic.  Cardiovascular: Normal rate and regular rhythm.   No murmur heard. Pulmonary/Chest: Effort normal and breath sounds normal. No respiratory distress. He has no wheezes. He has no  rales.  Musculoskeletal:  No tenderness noted at 16 fibromyalgia tenderpoints. 1+ bilateral LE edema  Neurological: He is alert and oriented to person, place, and time.  Skin: Skin is warm and dry.  Psychiatric: He has a normal mood and affect. His behavior is normal. Thought content normal.          Assessment & Plan:

## 2015-01-24 NOTE — Assessment & Plan Note (Signed)
Reports good compliance with CPAP. Continue.

## 2015-01-25 LAB — EPSTEIN-BARR VIRUS VCA ANTIBODY PANEL
EBV EA IgG: <5 U/mL (ref <9.0)
EBV NA IgG: >600 U/mL — ABNORMAL HIGH (ref <18.0)
EBV VCA IGG: 278 U/mL — AB (ref <18.0)
EBV VCA IgM: 101 U/mL — ABNORMAL HIGH (ref <36.0)

## 2015-01-25 LAB — HIV ANTIBODY (ROUTINE TESTING W REFLEX): HIV: NONREACTIVE

## 2015-01-25 LAB — B. BURGDORFI ANTIBODIES: B burgdorferi Ab IgG+IgM: 0.19 {ISR}

## 2015-01-26 ENCOUNTER — Encounter: Payer: Self-pay | Admitting: Family

## 2015-01-26 ENCOUNTER — Telehealth: Payer: Self-pay | Admitting: Family

## 2015-01-26 DIAGNOSIS — E876 Hypokalemia: Secondary | ICD-10-CM

## 2015-01-26 DIAGNOSIS — E291 Testicular hypofunction: Secondary | ICD-10-CM

## 2015-01-26 LAB — ROCKY MTN SPOTTED FVR AB, IGG-BLOOD: RMSF IGG: 0.11 IV

## 2015-01-26 NOTE — Telephone Encounter (Signed)
Please see letter. Pt will pick up at front desk.

## 2015-01-26 NOTE — Telephone Encounter (Signed)
Pt is requesting call from [facility], specifically, to go over lab results. [phone]

## 2015-01-27 NOTE — Telephone Encounter (Signed)
Patient was adamant about speaking with Kenneth Watts directly. advised patient Kenneth Watts is in between patients but he insisted on speaking with NP directly;

## 2015-01-27 NOTE — Telephone Encounter (Signed)
Letter placed at front desk for pick up

## 2015-01-27 NOTE — Telephone Encounter (Signed)
Spoke with patient. We discussed referral to ID, however he declines at this time. "I just need to wait it out." His testosterone level was low on the 5 clicks of testosterone/day (5% compounded formula).  He spoke with his pharmacist who advised him to change to testosterone 20% compounded formula 4 clicks/day.  I advised pt that I would think about the dosing and respond to him via my chart.  Pt verbalized understanding- see mychart message.

## 2015-01-27 NOTE — Telephone Encounter (Signed)
Contacted pt. Left message requesting that he return my call.

## 2015-01-27 NOTE — Telephone Encounter (Signed)
° °  Reason for call: Pt was returning [facility] call, pt states can call him at the number below. Cel phone number.

## 2015-01-27 NOTE — Telephone Encounter (Signed)
Per verbal from PCP, mono may take several months to resolve. Immune testing is the same and if pt is still very fatigued we can refer him to infectious disease to see if EBV is more chronic in nature. Also see mychart result messages. Left message for pt to return my call.

## 2015-02-08 ENCOUNTER — Encounter: Payer: Self-pay | Admitting: Family

## 2015-02-09 NOTE — Telephone Encounter (Signed)
Please contact pt re: unread mychart message. 

## 2015-02-10 NOTE — Telephone Encounter (Signed)
Notified pt and he voices understanding. He declines to schedule lab visit at this time. He asks that we mail dental letter to his home address.  Letter mailed.

## 2015-02-10 NOTE — Telephone Encounter (Signed)
Left message on voicemail to return my call.  

## 2015-03-24 ENCOUNTER — Encounter: Payer: Self-pay | Admitting: Family

## 2015-03-24 ENCOUNTER — Telehealth: Payer: Self-pay | Admitting: Family

## 2015-03-24 DIAGNOSIS — B2790 Infectious mononucleosis, unspecified without complication: Secondary | ICD-10-CM

## 2015-03-24 NOTE — Telephone Encounter (Signed)
Left detailed message on pt's voicemail that letter has been placed at front desk for pick up and to let us know if he is willing to proceed with ID consultation.

## 2015-03-24 NOTE — Telephone Encounter (Signed)
I would recommend that he request FMLA paperwork and schedule office visit to address.

## 2015-03-24 NOTE — Telephone Encounter (Signed)
Pt stating that he does not need FMLA that he just needs a letter. He said that he needs to talk with Gilmore Laroche today. Please call back at [phone].

## 2015-03-24 NOTE — Telephone Encounter (Signed)
Spoke with patient and he needs a Teacher, adult education stating it is necessary to cut back his schedule from 40 hours to 32 hours, He does not need FMLA but without a  letter stating it is medically necessary to cut back the hours he will not be able to have an extra day off. He said he has mono and needs the extra day to rest. Please advise    KP

## 2015-03-24 NOTE — Telephone Encounter (Signed)
Relation to XQ:JJHE Call back [place]   Reason for call:  Patient requesting a letter stating he can only work a certain amount hours do to him being fatigue and weakness. Please follow up with patient directly, employment requiring a letter from PCP.

## 2015-03-24 NOTE — Telephone Encounter (Signed)
I am happy to write note, however I would also recommend that he see ID if he is still symptomatic to get their input since his symptoms have been present for so long.

## 2015-03-29 ENCOUNTER — Encounter: Payer: Self-pay | Admitting: Family

## 2015-03-29 NOTE — Telephone Encounter (Signed)
Pt called back and states that employer has reviewed his letter and they are asking for PCP to call Wendee Beavers at [phone],  Fax [phone] or provide letter with additional information.  Pt states letter needs to include:  1.  Condition (mono) that is restricting his work hrs 2.  Why condition requires reduced work hours 3.  How long pt will need reduced hours? Temporary or permanent and pt states he would prefer permanent. 4.  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  Pt states he did not think infectious disease would be beneficial because he was told that they would not be able to treat him so he didn't think he should see them.  Please advise.

## 2015-03-29 NOTE — Telephone Encounter (Signed)
Letter faxed to below # and pt voices understanding re: ID consult but states he is not going to proceed with referral at this time. States if he needs work note extended beyond 3 months he will see ID. Referral cancelled.

## 2015-03-29 NOTE — Telephone Encounter (Addendum)
See second letter I wrote today.  Please advise patient that I am not entirely certain that his fatigue is related to mono, and that is why I would like for him to meet with ID for further evaluation.  I have written him for reduced hours for 3 months.  I will need ID to weigh in before I can write him out for reduced work permanently.  If they do not feel that the fatigue is mono related, then we will need to do some additional work up.

## 2015-04-17 ENCOUNTER — Telehealth: Payer: Self-pay | Admitting: Family

## 2015-04-17 DIAGNOSIS — G4733 Obstructive sleep apnea (adult) (pediatric): Secondary | ICD-10-CM

## 2015-04-17 NOTE — Telephone Encounter (Signed)
Pt called in to request RX for "cpap mask and supplies" be sent to [facility] Fax# [phone]

## 2015-04-18 ENCOUNTER — Telehealth: Payer: Self-pay | Admitting: Family

## 2015-04-18 NOTE — Telephone Encounter (Signed)
Caller name: Kaishawn   Relationship to patient: Self  Can be reached: [phone]  Reason for call: Pt is requesting a call back from [facility] directly, pt wouldn't discuss what the call back is for. It seemed to be private. Please call back to discuss concern further.   Thanks.

## 2015-04-18 NOTE — Telephone Encounter (Signed)
Orders were sent to [facility]. They are needing pt's most recent sleep study. Per referral notes, pt states [facility] was supposed to fax result to [place] yesterday. Message sent to [facility] with [facility] for status update.

## 2015-04-18 NOTE — Telephone Encounter (Addendum)
Relation to [id number] Call back [place]   Reason for call:  Patient calling checking on the status of below message

## 2015-04-18 NOTE — Telephone Encounter (Signed)
See 04/17/15 phone note.

## 2015-04-24 NOTE — Telephone Encounter (Signed)
Sleep Study received and faxed to [facility] at [phone].

## 2015-04-27 ENCOUNTER — Telehealth: Payer: Self-pay | Admitting: Family

## 2015-04-27 NOTE — Telephone Encounter (Signed)
Caller name: Self   Can be reached: [phone]   Reason for call: Need for Kenneth Watts to call him about his CPAP supplies

## 2015-04-28 NOTE — Telephone Encounter (Signed)
Previous sleep study has been placed in blue folder on my desk.

## 2015-04-28 NOTE — Telephone Encounter (Signed)
Spoke with pt. He states he is unhappy with [facility] as he called them yesterday and they told him they never received the sleep study we faxed them. He wishes to use [facility] and states they told him he would have to see his provider within 6 months for insurance purposes. Pt also wants to have vitamin D, K+ and testosterone levels rechecked. Scheduled pt appt with PCP for 05/02/15 at 1:15pm.

## 2015-05-03 ENCOUNTER — Ambulatory Visit (INDEPENDENT_AMBULATORY_CARE_PROVIDER_SITE_OTHER): Payer: Medicare HMO | Admitting: Family

## 2015-05-03 ENCOUNTER — Encounter: Payer: Self-pay | Admitting: Family

## 2015-05-03 VITALS — BP 142/86 | HR 86 | Temp 97.7°F | Resp 16 | Ht 72.0 in | Wt 280.4 lb

## 2015-05-03 DIAGNOSIS — E291 Testicular hypofunction: Secondary | ICD-10-CM | POA: Diagnosis not present

## 2015-05-03 DIAGNOSIS — E559 Vitamin D deficiency, unspecified: Secondary | ICD-10-CM | POA: Diagnosis not present

## 2015-05-03 DIAGNOSIS — I10 Essential (primary) hypertension: Secondary | ICD-10-CM

## 2015-05-03 DIAGNOSIS — G4733 Obstructive sleep apnea (adult) (pediatric): Secondary | ICD-10-CM

## 2015-05-03 DIAGNOSIS — F209 Schizophrenia, unspecified: Secondary | ICD-10-CM

## 2015-05-03 DIAGNOSIS — E876 Hypokalemia: Secondary | ICD-10-CM

## 2015-05-03 LAB — BASIC METABOLIC PANEL
BUN: 10 mg/dL (ref 6–23)
CALCIUM: 9 mg/dL (ref 8.4–10.5)
CO2: 30 mEq/L (ref 19–32)
CREATININE: 1.03 mg/dL (ref 0.40–1.50)
Chloride: 101 mEq/L (ref 96–112)
GFR: 75.79 mL/min (ref >60.00)
GLUCOSE: 119 mg/dL — AB (ref 70–99)
Potassium: 3.6 mEq/L (ref 3.5–5.1)
SODIUM: 140 meq/L (ref 135–145)

## 2015-05-03 LAB — VITAMIN D 25 HYDROXY (VIT D DEFICIENCY, FRACTURES): VITD: 29.21 ng/mL — ABNORMAL LOW (ref 30.00–100.00)

## 2015-05-03 MED ORDER — CHOLECALCIFEROL 50 MCG (2000 UT) PO TABS: 1.0000 | ORAL_TABLET | Freq: Every day | ORAL | Status: DC

## 2015-05-03 NOTE — Patient Instructions (Signed)
Please complete lab work prior to leaving.   

## 2015-05-03 NOTE — Progress Notes (Signed)
Subjective:    Patient ID: Kenneth Watts, male    DOB: [birth year], 69 y.o.   MRN: [id number]  HPI   Kenneth Watts presents today for follow up.   OSA-  Reports good compliance with CPAP.  Wakes up feeling rested.    Hypogonadism- Seeing Dr Burnell Blanks for testosterone.    Gout- No gout symptoms  Schizophrenia- Continues to follow with psychiatry- Dr. Judithann Sheen.  Reports symptoms are well managed on current med regimen.  Review of Systems See HPI  Past Medical History  Diagnosis Date  . Hypertension   . Diverticulitis   . Melanoma ([place])   . Schizophrenia ([place])   . 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 OSA (obstructive sleep apnea)     Social History   Social History  . Marital Status: Single    Spouse Name: N/A  . Number of Children: N/A  . Years of Education: N/A   Occupational History  . Not on file.   Social History Main Topics  . Smoking status: Former Smoker -- 19 years    Quit date: 05/20/1984  . Smokeless tobacco: Never Used  . Alcohol Use: No  . Drug Use: Not on file  . Sexual Activity: Not on file   Other Topics Concern  . Not on file   Social History Narrative   Works midnight shift as a Presenter, broadcasting   1 son in [place]   1 son in [place]- lives with a significant other   Enjoys working on Teaching laboratory technician, goes to [employer]   Girlfriend has a Architectural technologist in counselor/education    Has worked in the past as a Community education officer in the past.               Past Surgical History  Procedure Laterality Date  . Cholecystectomy    . Melanoma excision Left 10/01/12    arm    Family History  Problem Relation Age of Onset  . Alcohol abuse Mother   . Hypertension Father   . Cancer Paternal Uncle   . Diabetes Neg Hx   . Heart disease Neg Hx     Allergies  Allergen Reactions  . Lisinopril     Cough    Current Outpatient Prescriptions on File Prior to Visit  Medication Sig Dispense Refill  . Coenzyme  Q10 (CO Q 10 PO) Take by mouth.    . fexofenadine (ALLEGRA) 180 MG tablet Take 180 mg by mouth daily.    Marland Kitchen losartan-hydrochlorothiazide (HYZAAR) 50-12.5 MG per tablet Take 1 tablet by mouth daily. 90 tablet 1  . MILK THISTLE PO Take by mouth.    . Multiple Vitamin (MULTIVITAMIN) tablet Take 1 tablet by mouth daily.    . NONFORMULARY OR COMPOUNDED ITEM Testosterone 5% HRT (MEN)-Apply 4 clicks from dispenser once daily. (Patient taking differently: Testosterone 20% HRT (MEN)-Apply 5 clicks from dispenser once daily.) 30 each 4  . QUEtiapine (SEROQUEL) 100 MG tablet Take 50 mg by mouth at bedtime.     No current facility-administered medications on file prior to visit.    BP 142/86 mmHg  Pulse 86  Temp(Src) 97.7 F (36.5 C) (Oral)  Resp 16  Ht 6' (1.829 m)  Wt 280 lb 6.4 oz (127.189 kg)  BMI 38.02 kg/m2  SpO2 99%       Objective:   Physical Exam  Constitutional: He is oriented to person, place, and time. He appears well-developed  and well-nourished. No distress.  HENT:  Head: Normocephalic and atraumatic.  Cardiovascular: Normal rate and regular rhythm.   No murmur heard. Pulmonary/Chest: Effort normal and breath sounds normal. No respiratory distress. He has no wheezes. He has no rales.  Musculoskeletal: He exhibits no edema.  Neurological: He is alert and oriented to person, place, and time.  Skin: Skin is warm and dry.  Psychiatric: He has a normal mood and affect. His behavior is normal. Thought content normal.          Assessment & Plan:

## 2015-05-03 NOTE — Progress Notes (Signed)
Pre visit review using our clinic review tool, if applicable. No additional management support is needed unless otherwise documented below in the visit note. 

## 2015-05-03 NOTE — Assessment & Plan Note (Signed)
On otc supplement, obtain follow up vit d level.

## 2015-05-03 NOTE — Assessment & Plan Note (Signed)
Reports good compliance with CPAP, continue same.  Refer to [facility] at pt reques.

## 2015-05-03 NOTE — Assessment & Plan Note (Signed)
bp stable on current meds.  Obtain follow up bmet to assess K+

## 2015-05-03 NOTE — Assessment & Plan Note (Signed)
Stable, management per psych 

## 2015-05-03 NOTE — Assessment & Plan Note (Signed)
Pt is requesting follow up testosterone level, this is being managed by urology ([place]).

## 2015-05-04 NOTE — Telephone Encounter (Signed)
Kenneth Watts with [facility] Ph# [phone] Fax# [phone]  Needs copy of sleep study Need OV notes w/in 6 months Needs insurance info (copy of card)

## 2015-05-05 ENCOUNTER — Encounter: Payer: Self-pay | Admitting: Family

## 2015-05-05 ENCOUNTER — Telehealth: Payer: Self-pay | Admitting: Family

## 2015-05-05 DIAGNOSIS — R739 Hyperglycemia, unspecified: Secondary | ICD-10-CM

## 2015-05-05 LAB — TESTOSTERONE, FREE & TOTAL
FREE TESTOSTERONE: 5.2
Testosterone: 312

## 2015-05-05 MED ORDER — VITAMIN D3 75 MCG (3000 UT) PO TABS: 1.0000 | ORAL_TABLET | Freq: Every day | ORAL | Status: DC

## 2015-05-05 NOTE — Telephone Encounter (Signed)
Please let pt know that his vit D is mildly low. Increase vit D supplement to 3000 units once daily. Sugar is mildly elevated. Can you please see if lab can add on A1C

## 2015-05-05 NOTE — Telephone Encounter (Signed)
Below info faxed to below #, attn:  Tonisha.

## 2015-05-05 NOTE — Telephone Encounter (Signed)
Results faxed to Dr Burnell Blanks at [phone].

## 2015-05-07 ENCOUNTER — Encounter: Payer: Self-pay | Admitting: Family

## 2015-05-08 ENCOUNTER — Telehealth: Payer: Self-pay | Admitting: *Deleted

## 2015-05-08 ENCOUNTER — Telehealth: Payer: Self-pay | Admitting: Family

## 2015-05-08 NOTE — Telephone Encounter (Signed)
Forwarded to Melissa. JG//CMA  

## 2015-05-08 NOTE — Telephone Encounter (Signed)
See 05/07/15 mychart message.

## 2015-05-08 NOTE — Telephone Encounter (Signed)
Caller name: Mizell  Relationship to patient: Self   Can be reached: [phone]  Reason for call: Pt says that he is unable to see his testosterone results on MyChart. He would like to know the results.

## 2015-05-10 ENCOUNTER — Ambulatory Visit (INDEPENDENT_AMBULATORY_CARE_PROVIDER_SITE_OTHER): Payer: Medicare HMO | Admitting: Family

## 2015-05-10 ENCOUNTER — Encounter: Payer: Self-pay | Admitting: Family

## 2015-05-10 VITALS — BP 120/80 | HR 73 | Temp 97.7°F | Resp 16 | Ht 72.0 in | Wt 277.4 lb

## 2015-05-10 DIAGNOSIS — E291 Testicular hypofunction: Secondary | ICD-10-CM

## 2015-05-10 DIAGNOSIS — Z0000 Encounter for general adult medical examination without abnormal findings: Secondary | ICD-10-CM

## 2015-05-10 DIAGNOSIS — R739 Hyperglycemia, unspecified: Secondary | ICD-10-CM | POA: Diagnosis not present

## 2015-05-10 DIAGNOSIS — E785 Hyperlipidemia, unspecified: Secondary | ICD-10-CM

## 2015-05-10 DIAGNOSIS — E559 Vitamin D deficiency, unspecified: Secondary | ICD-10-CM | POA: Diagnosis not present

## 2015-05-10 LAB — LIPID PANEL
Cholesterol: 134 mg/dL (ref 0–200)
HDL: 46 mg/dL (ref >39.00)
LDL Cholesterol: 57 mg/dL (ref 0–99)
NonHDL: 88.27
TRIGLYCERIDES: 157 mg/dL — AB (ref 0.0–149.0)
Total CHOL/HDL Ratio: 3
VLDL: 31.4 mg/dL (ref 0.0–40.0)

## 2015-05-10 LAB — HEMOGLOBIN A1C: HEMOGLOBIN A1C: 5.4 % (ref 4.6–6.5)

## 2015-05-10 NOTE — Progress Notes (Signed)
Pre visit review using our clinic review tool, if applicable. No additional management support is needed unless otherwise documented below in the visit note. 

## 2015-05-10 NOTE — Progress Notes (Signed)
Subjective:    Patient ID: Kenneth Watts, male    DOB: 1945-08-28, 69 y.o.   MRN: [id number]  HPI  [name] is a 69 yr old male who presents today for follow up. Wish to discuss lab results.  Hyperglycemia- sugar was 119- non-fasting  Vit D- concerned re: vit D being low (29.2)  Would like cholesterol checked.  Hypogonadism- concerned that level still low.      Review of Systems See HPI  Past Medical History  Diagnosis Date  . Hypertension   . Diverticulitis   . Melanoma ([place])   . Schizophrenia ([place])   . 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 OSA (obstructive sleep apnea)     Social History   Social History  . Marital Status: Single    Spouse Name: N/A  . Number of Children: N/A  . Years of Education: N/A   Occupational History  . Not on file.   Social History Main Topics  . Smoking status: Former Smoker -- 19 years    Quit date: 05/20/1984  . Smokeless tobacco: Never Used  . Alcohol Use: No  . Drug Use: Not on file  . Sexual Activity: Not on file   Other Topics Concern  . Not on file   Social History Narrative   Works midnight shift as a Presenter, broadcasting   1 son in [place]   1 son in [place]- lives with a significant other   Enjoys working on Teaching laboratory technician, goes to [employer]   Girlfriend has a Architectural technologist in counselor/education    Has worked in the past as a Community education officer in the past.               Past Surgical History  Procedure Laterality Date  . Cholecystectomy    . Melanoma excision Left 10/01/12    arm    Family History  Problem Relation Age of Onset  . Alcohol abuse Mother   . Hypertension Father   . Cancer Paternal Uncle   . Diabetes Neg Hx   . Heart disease Neg Hx     Allergies  Allergen Reactions  . Lisinopril     Cough    Current Outpatient Prescriptions on File Prior to Visit  Medication Sig Dispense Refill  . Cholecalciferol (VITAMIN D3) 3000 UNITS TABS Take 1 tablet  by mouth daily. 30 tablet   . Coenzyme Q10 (CO Q 10 PO) Take 200 mg by mouth daily.     . fexofenadine (ALLEGRA) 180 MG tablet Take 180 mg by mouth daily.    Marland Kitchen losartan-hydrochlorothiazide (HYZAAR) 50-12.5 MG per tablet Take 1 tablet by mouth daily. 90 tablet 1  . MILK THISTLE PO Take 1,000 mg by mouth daily.     . Multiple Vitamin (MULTIVITAMIN) tablet Take 4 tablets by mouth daily.     . QUEtiapine (SEROQUEL) 100 MG tablet Take 50 mg by mouth at bedtime.     No current facility-administered medications on file prior to visit.    BP 120/80 mmHg  Pulse 73  Temp(Src) 97.7 F (36.5 C) (Oral)  Resp 16  Ht 6' (1.829 m)  Wt 277 lb 6.4 oz (125.828 kg)  BMI 37.61 kg/m2  SpO2 97%       Objective:   Physical Exam  Constitutional: He is oriented to person, place, and time. He appears well-developed and well-nourished. No distress.  HENT:  Head: Normocephalic and atraumatic.  Neurological: He is alert and oriented to person, place, and time.  Psychiatric: He has a normal mood and affect. His behavior is normal. Thought content normal.          Assessment & Plan:

## 2015-05-10 NOTE — Assessment & Plan Note (Signed)
Last lipid panel was normal, pt wishes to recheck lipids.

## 2015-05-10 NOTE — Assessment & Plan Note (Signed)
Advised pt to increase vit D from 2000 units to 4000 units once daily

## 2015-05-10 NOTE — Assessment & Plan Note (Signed)
Obtain A1c.  

## 2015-05-10 NOTE — Patient Instructions (Signed)
Please complete lab work prior to leaving.   

## 2015-05-10 NOTE — Assessment & Plan Note (Signed)
Advised pt to discuss testosterone treatment with his urologist (Dr. Burnell Blanks).

## 2015-05-11 ENCOUNTER — Encounter: Payer: Self-pay | Admitting: Family

## 2015-05-12 NOTE — Telephone Encounter (Signed)
Reviewed with pt at his visit.

## 2015-05-12 NOTE — Telephone Encounter (Signed)
Completed form faxed to [facility] at [phone]

## 2015-08-08 ENCOUNTER — Telehealth: Payer: Self-pay | Admitting: Family

## 2015-08-08 DIAGNOSIS — G4733 Obstructive sleep apnea (adult) (pediatric): Secondary | ICD-10-CM

## 2015-08-08 NOTE — Telephone Encounter (Signed)
Caller name:Self  Can be reached: [phone]   Reason for call: Patient is changing where he gets his  C-PAP supplies and needs a rx faxed to  [phone]

## 2015-08-08 NOTE — Telephone Encounter (Signed)
Pt called back stating that he received poor service with [facility]. States "they messed up his order twice". Pt is requesting that we fax new Rx for CPAP supplies and previous sleep study to [facility] at below #.  I have pended CPAP order.  Please advise if ok to proceed?

## 2015-08-08 NOTE — Telephone Encounter (Signed)
This is the [facility] pt has asked [place] to send CPAP supplies to. Left message for pt to call me back and let us know what happened with referral to [facility].

## 2015-08-08 NOTE — Telephone Encounter (Signed)
CPAP rx, sleep study result and 05/03/15 office note faxed to below #.  Notified pt.

## 2015-08-08 NOTE — Telephone Encounter (Signed)
Ok  To proceed.

## 2015-08-17 ENCOUNTER — Telehealth: Payer: Self-pay | Admitting: Family

## 2015-08-17 NOTE — Telephone Encounter (Signed)
Patient returning your call but states he would like to speak with Gilmore Laroche directly.

## 2015-08-17 NOTE — Telephone Encounter (Signed)
Caller name:Self  Can be reached: [phone]   Reason for call: Patient request call back about diabetic supplier. States that he thought his insurance was accepted by the supplier that he had selected but found out today that it is not.

## 2015-08-17 NOTE — Telephone Encounter (Signed)
Called patient. No answer. Left message for return call.

## 2015-08-18 NOTE — Telephone Encounter (Addendum)
See 04/26/16 phone note.   Spoke with pt today. He states [facility] does not accept [employer] and he would have to pay out of network for CPAP supplies. He now wishes to try [employer] [facility] and requests that we fax below info to them at [phone]. Advised pt that all home health agencies can take 3-4 weeks to get insurance approval and contact pt to set up supplies and he can check directly with [facility] regarding status. We will fax info to [place]. Advised pt we have run out of other DME suppliers for his CPAP and he voices understanding. States he will not need new supplies for several months but wants to get established new company.  Faxed 05/03/15 office note, CPAP rx, 2012 sleep study and insurance card to above #.

## 2015-09-05 ENCOUNTER — Telehealth: Payer: Self-pay | Admitting: Family

## 2015-09-05 DIAGNOSIS — B2790 Infectious mononucleosis, unspecified without complication: Secondary | ICD-10-CM

## 2015-09-05 NOTE — Telephone Encounter (Signed)
Pt is requesting a infectious disease referral . Pt would like a call back from CMA to discuss further.    [phone]

## 2015-09-05 NOTE — Telephone Encounter (Signed)
Spoke with pt. He states PCP recommended referral to infectious disease for evaluation of ongoing "mono" and fatigue x 10 months. Pt states she would like to see Eyvonne Mechanic at [facility] [phone]). He contacted their office and they reviewed his information and told him they did not feel his condition was serious enough to warrant evaluation with them. Pt would still like to pursue ID referral and wants PCP recommendation or intercession with Dr Janalyn Harder. Can be seen any day / time except Monday and Tuesday pm.  Please advise?

## 2015-09-07 ENCOUNTER — Telehealth: Payer: Self-pay | Admitting: Family

## 2015-09-07 DIAGNOSIS — B2790 Infectious mononucleosis, unspecified without complication: Secondary | ICD-10-CM

## 2015-09-07 NOTE — Telephone Encounter (Signed)
Spoke to Dr. Bonner Puna ID on call at [facility].  He reviewed patient's record.  His recommendation is that we repeat the EBV panel using another lab (not [facility]).  If IgM remains positive, then they will see him for further evaluation. If IgM is negative then consult is not indicated.    Gilmore Laroche, could you please speak to lab and see about sending EBV panel to [facility] dx is Mono.  Let patient know that [facility] would like [place] to repeat test and if still abnormal they will see him. If not abnormal, then mono is considered resolved and consult is not indicated.

## 2015-09-07 NOTE — Telephone Encounter (Signed)
Kenneth Watts- please see mychart message. thanks

## 2015-09-07 NOTE — Telephone Encounter (Signed)
Spoke with Dr. Leontine Locket, [place] physician on call. Reviewed case.  He will review the patient's records and get back to me.

## 2015-09-08 ENCOUNTER — Other Ambulatory Visit (INDEPENDENT_AMBULATORY_CARE_PROVIDER_SITE_OTHER): Payer: Medicare HMO

## 2015-09-08 DIAGNOSIS — B2790 Infectious mononucleosis, unspecified without complication: Secondary | ICD-10-CM | POA: Diagnosis not present

## 2015-09-08 NOTE — Telephone Encounter (Signed)
Notified pt and he is agreeable to proceed with EBV panel. I have pended a lab order, please advise if this is the correct panel?  Pt also requesting additional tests be run to see if he has any developed any blood disorders as a result of his "chronic mono".  I advised pt that we would need to start with EBV panel first and if positive then ID may want to do additional testing. Pt voices understanding.

## 2015-09-08 NOTE — Addendum Note (Signed)
Addended by: Kelle Darting A on: 09/08/2015 07:53 AM   Modules accepted: Orders

## 2015-09-12 ENCOUNTER — Telehealth: Payer: Self-pay | Admitting: Family

## 2015-09-12 ENCOUNTER — Encounter: Payer: Self-pay | Admitting: Family

## 2015-09-12 NOTE — Telephone Encounter (Signed)
Pt called in because he is waiting for his lab results to show on MyChart. Pt would like to make PCP aware that results arent showing on My Chart.

## 2015-09-12 NOTE — Telephone Encounter (Signed)
Will ask [facility] to fax letter and results to ID specialist at [facility] for evaluation.

## 2015-09-12 NOTE — Telephone Encounter (Signed)
See previous mychart messages and notes.

## 2015-09-12 NOTE — Telephone Encounter (Signed)
For some reason, [facility] Patient Virus result will not be available in mychart. [facility] is going to fax [place] a hard copy today. Awaiting result.

## 2015-09-12 NOTE — Telephone Encounter (Signed)
Caller name: Self  Can be reached: [phone]   Reason for call: Patient called stating that he is still waiting for his lab results to be posted to My Chart

## 2015-09-12 NOTE — Telephone Encounter (Signed)
See 09/05/15 phone note / mychart messages.

## 2015-09-12 NOTE — Telephone Encounter (Signed)
Result received and forwarded to PCP for signature. Please advise?

## 2015-09-13 NOTE — Telephone Encounter (Signed)
Pt called in because he says that he spoke with the referral location and was informed that Dr. Leontine Locket would be out of the office for a few days. He says that Lurena Joiner his assistant request that result be resent to her at fax: [phone] and she will assist pt further.

## 2015-09-13 NOTE — Telephone Encounter (Signed)
Referral co-ordinator is re-faxing to below #.

## 2015-09-25 ENCOUNTER — Encounter: Payer: Self-pay | Admitting: Family

## 2015-09-26 NOTE — Telephone Encounter (Signed)
Gilmore Laroche, could you please request office visit notes from [facility] Infectious disease? I can't see them in care everywhere. Thanks.

## 2015-09-27 NOTE — Telephone Encounter (Signed)
Spoke with Kenneth Watts at [facility] and was advised to fax request to [phone].  Ph) [phone].  Request faxed. Awaiting results.

## 2015-09-28 NOTE — Telephone Encounter (Signed)
Medical records received and forwarded to Melissa/Tricia. JG//CMA

## 2015-10-30 ENCOUNTER — Encounter: Payer: Self-pay | Admitting: Family

## 2015-10-31 NOTE — Telephone Encounter (Signed)
Spoke with pt and advised need for follow up in the office to reassess ongoing fatigue per PA, Hassell Done since pt has other contributing factors (OSA, low testosterone, vitamin D deficiency). Pt voices understanding and scheduled f/u with PCP for 11/09/15 at 9:45am.

## 2015-11-09 ENCOUNTER — Telehealth: Payer: Self-pay | Admitting: Family

## 2015-11-09 ENCOUNTER — Ambulatory Visit (INDEPENDENT_AMBULATORY_CARE_PROVIDER_SITE_OTHER): Payer: Medicare HMO | Admitting: Family

## 2015-11-09 ENCOUNTER — Encounter: Payer: Self-pay | Admitting: Family

## 2015-11-09 VITALS — BP 132/78 | HR 92 | Temp 98.2°F | Resp 18 | Ht 72.0 in | Wt 286.2 lb

## 2015-11-09 DIAGNOSIS — G4733 Obstructive sleep apnea (adult) (pediatric): Secondary | ICD-10-CM

## 2015-11-09 DIAGNOSIS — E559 Vitamin D deficiency, unspecified: Secondary | ICD-10-CM

## 2015-11-09 DIAGNOSIS — I10 Essential (primary) hypertension: Secondary | ICD-10-CM | POA: Diagnosis not present

## 2015-11-09 LAB — BASIC METABOLIC PANEL
BUN: 15 mg/dL (ref 6–23)
CHLORIDE: 104 meq/L (ref 96–112)
CO2: 32 mEq/L (ref 19–32)
CREATININE: 0.99 mg/dL (ref 0.40–1.50)
Calcium: 9.1 mg/dL (ref 8.4–10.5)
GFR: 79.22 mL/min (ref >60.00)
Glucose, Bld: 96 mg/dL (ref 70–99)
Potassium: 3.5 mEq/L (ref 3.5–5.1)
SODIUM: 141 meq/L (ref 135–145)

## 2015-11-09 LAB — VITAMIN D 25 HYDROXY (VIT D DEFICIENCY, FRACTURES): VITD: 49.08 ng/mL (ref 30.00–100.00)

## 2015-11-09 MED ORDER — LOSARTAN POTASSIUM-HCTZ 50-12.5 MG PO TABS: 1.0000 | ORAL_TABLET | Freq: Every day | ORAL | Status: DC

## 2015-11-09 NOTE — Telephone Encounter (Signed)
Spoke with pt and he states his insurance said his copay for sleep study in hospital setting would be $95. Free standing sleep center $45. Pt states there is a PA with [facility] that accepts his insurance, Kenneth Watts that he would like to be referred to due to cost. [phone]. ([facility]) Verified that PA does treat OSA.  Needs appt on Wed, Th or Fri. Referral pended. Please advise.

## 2015-11-09 NOTE — Telephone Encounter (Signed)
°  Relationship to patient: Self Can be reached: [phone] Pharmacy:  Reason for call: Patient called back following appt and request a call back to discuss the Sleep [facility]

## 2015-11-09 NOTE — Patient Instructions (Addendum)
Please reschedule your follow up with ID at [facility]. Complete lab work prior to leaving. Follow up after 12/21 for wellness visit.

## 2015-11-09 NOTE — Progress Notes (Signed)
Subjective:    Patient ID: Kenneth Watts, male    DOB: 01/17/1946, 70 y.o.   MRN: [id number]  HPI  Kenneth Watts is a 70 yr old male who presents today to discuss fatigue. He was seen at [facility] ID clinic due to his abnormal EBV serologies.  Office note 09/15/15 is reviewed. It was felt that his EBV serologies were most consistent with prior infection.  They obtained CRP/ESR/TSH and recommended possible follow up in 4-6 months to repeat EBV serologies.  They encouraged daily moderate exercise and considered possibility that his symptoms could be related OSA, low testosterone or side effects of his medications.  He was noted to have a mild anemia (hgb 13.5). TSH was normal, ESR and CRP were also normal.  Pt reports that over the last 4-5 weeks he has had some good days.  Reports good compliance with CPAP. Met with sleep specialist 4-5 years ago. Remains concerned re: + EBV IgM.  Denies fevers.   Review of Systems    see HPI  Past Medical History  Diagnosis Date  . Hypertension   . Diverticulitis   . Melanoma ([place])   . Schizophrenia ([place])   . Avascular necrosis of bones of both hips ([place])   . OSA (obstructive sleep apnea)      Social History   Social History  . Marital Status: Single    Spouse Name: N/A  . Number of Children: N/A  . Years of Education: N/A   Occupational History  . Not on file.   Social History Main Topics  . Smoking status: Former Smoker -- 19 years    Quit date: 05/20/1984  . Smokeless tobacco: Never Used  . Alcohol Use: No  . Drug Use: Not on file  . Sexual Activity: Not on file   Other Topics Concern  . Not on file   Social History Narrative   Works midnight shift as a Presenter, broadcasting   1 son in [place]   1 son in [place]- lives with a significant other   Enjoys working on Teaching laboratory technician, goes to [employer]   Girlfriend has a Architectural technologist in counselor/education    Has worked in the past as a Community education officer in the  past.               Past Surgical History  Procedure Laterality Date  . Cholecystectomy    . Melanoma excision Left 10/01/12    arm    Family History  Problem Relation Age of Onset  . Alcohol abuse Mother   . Hypertension Father   . Cancer Paternal Uncle   . Diabetes Neg Hx   . Heart disease Neg Hx     Allergies  Allergen Reactions  . Lisinopril     Cough    Current Outpatient Prescriptions on File Prior to Visit  Medication Sig Dispense Refill  . Cholecalciferol (VITAMIN D3) 3000 UNITS TABS Take 1 tablet by mouth daily. 30 tablet   . Coenzyme Q10 (CO Q 10 PO) Take 200 mg by mouth daily.     Marland Kitchen MILK THISTLE PO Take 1,000 mg by mouth daily.     . Multiple Vitamin (MULTIVITAMIN) tablet Take 4 tablets by mouth daily.     . NONFORMULARY OR COMPOUNDED ITEM TESTOSTERONE 20%. Apply 5 clicks once a day.    Marland Kitchen QUEtiapine (SEROQUEL) 100 MG tablet Take 50 mg by mouth at bedtime.     No current  facility-administered medications on file prior to visit.    BP 132/78 mmHg  Pulse 92  Temp(Src) 98.2 F (36.8 C) (Oral)  Resp 18  Ht 6' (1.829 m)  Wt 286 lb 3.2 oz (129.819 kg)  BMI 38.81 kg/m2  SpO2 96%    Objective:   Physical Exam  Constitutional: He is oriented to person, place, and time. He appears well-developed and well-nourished. No distress.  HENT:  Head: Normocephalic and atraumatic.  Cardiovascular: Normal rate and regular rhythm.   No murmur heard. Pulmonary/Chest: Effort normal and breath sounds normal. No respiratory distress. He has no wheezes. He has no rales.  Musculoskeletal: He exhibits no edema.  Neurological: He is alert and oriented to person, place, and time.  Skin: Skin is warm and dry.  Psychiatric: He has a normal mood and affect. His behavior is normal. Thought content normal.          Assessment & Plan:  Fatigue- improving but not back to pt's baseline. I advised pt that ID does not feel that his + IgM represents acute EBV.  I suspect that  his fatigue is likely related to OSA. I have advised him that I would like for him to meet with a sleep specialist for further evaluation of his CPAP settings  He declines at this time but will consider.  HTN- BP stable, obtain follow up bmet. Continue current meds  Vit D deficiency- will check follow up vit D level.

## 2015-11-10 ENCOUNTER — Encounter: Payer: Self-pay | Admitting: Family

## 2015-11-10 ENCOUNTER — Telehealth: Payer: Self-pay | Admitting: Family

## 2015-11-10 NOTE — Telephone Encounter (Signed)
Caller name: Relationship to patient: Self Can be reached: [phone]   Reason for call: Request call back to discuss CPAP Supplies

## 2015-11-11 ENCOUNTER — Encounter: Payer: Self-pay | Admitting: Family

## 2015-11-14 NOTE — Telephone Encounter (Signed)
Notified pt. He is still unhappy as he was told by the sleep therapist that this new machine "does the diagnostics itself and a sleep study is not required". Reiterated below recommendation from PCP and that he is more than welcome to discuss new machine with sleep specialist. Pt voices understanding but "remains unsatisfied" and has appt to see them this Thursday.

## 2015-11-14 NOTE — Telephone Encounter (Signed)
Left message for pt to return my call.

## 2015-11-14 NOTE — Telephone Encounter (Signed)
Spoke with pt. He now states that he has spoken with a representative at [employer]; new machine called airsense 10 and representative said that he didn't think a new sleep study would be needed. Advised pt per verbal from PCP that he should proceed with referral to sleep specialist to determine what he needs from this point forward. Pt states he is not going to do another sleep study and he is not happy with this decision because he knows what he needs but will proceed with consultation anyway.

## 2015-11-14 NOTE — Telephone Encounter (Signed)
Noted.  Due to ongoing fatigue despite compliance with CPAP, would like further evaluation from sleep specialist.

## 2015-11-18 ENCOUNTER — Encounter: Payer: Self-pay | Admitting: Family

## 2015-11-19 ENCOUNTER — Encounter: Payer: Self-pay | Admitting: Family

## 2015-11-20 NOTE — Telephone Encounter (Signed)
Kenneth Watts-- please see additional mychart message. Pt is cancelling above request at this time.

## 2015-12-22 ENCOUNTER — Telehealth: Payer: Self-pay | Admitting: *Deleted

## 2015-12-22 NOTE — Telephone Encounter (Signed)
Received Continued Medical Necessity paperwork via fax from [facility].   Placed in folder for Melissa to review and complete.//AB/CMA

## 2016-01-24 ENCOUNTER — Telehealth: Payer: Self-pay | Admitting: *Deleted

## 2016-01-24 NOTE — Telephone Encounter (Signed)
Received fax of continued medical necessity for CPAP device. Pt was set up by [facility] on 11/22/15 and insurance is requiring follow up with PCP 31 to 91 days after initial setup to re-evaluate for continued medicare coverage. Spoke with pt. He states that he continues to use his CPAP and has no improvement of fatigue. He continues to feel it is infectious disease related. He is unhappy that we were not able to determine / treat the cause of his fatigue and states he has as an appointment to establish care with a new doctor in [place] later this month and will also be seeing Dr Marlou Sa for OSA next week. Advised him to have Dr Marlou Sa submit information to [place] about CPAP usage and necessity for continued coverage and pt voices understanding.

## 2016-01-25 NOTE — Telephone Encounter (Signed)
Noted  

## 2016-02-09 ENCOUNTER — Telehealth: Payer: Self-pay

## 2016-02-09 NOTE — Telephone Encounter (Signed)
Procedure: left total hip replacement Surgery Date: pending  Last CPE:  05/10/15 Last EKG: 07/16/13   Pt needs an appointment.  Please call patient and schedule.

## 2016-02-13 NOTE — Telephone Encounter (Signed)
Called pt to schedule surgical clearance appt. Unable to reach by phone. Sent pt a My Chart message to schedule appt at his earliest convenience.     Thanks.

## 2016-02-13 NOTE — Telephone Encounter (Signed)
I spoke with pt earlier this month and he is transferring his care to another Practice. I think we can discard surgical clearance request. He previously told me that he had an appointment to establish care with an MD in [place] around the end of this month. Maybe you can send request back to Ortho and let them know we will not be doing pt's surgical clearance.

## 2016-02-14 NOTE — Telephone Encounter (Signed)
Noted. Thanks Gilmore Laroche.

## 2018-01-02 ENCOUNTER — Other Ambulatory Visit (HOSPITAL_COMMUNITY): Payer: Medicare HMO

## 2018-01-13 ENCOUNTER — Inpatient Hospital Stay: Admit: 2018-01-13 | Payer: Medicare HMO | Admitting: Orthopedic Surgery

## 2018-01-13 SURGERY — ARTHROPLASTY, HIP, TOTAL,POSTERIOR APPROACH
Anesthesia: Choice | Laterality: Left

## 2018-04-20 ENCOUNTER — Telehealth: Payer: Self-pay | Admitting: *Deleted

## 2018-04-20 NOTE — Telephone Encounter (Signed)
Attempted to reach pt. Left detailed message on voicemail that I am working in the lab today and it is very difficult to be able to speak with him via phone. Advised pt to give pertinent information to the [facility] or send a mychart message.

## 2018-04-20 NOTE — Telephone Encounter (Signed)
Copied from [place] [phone]. Topic: General - Other >> Apr 15, 2018  3:04 PM [name] wrote: Reason for CRM: Patient called to speak with the nurse Larena Glassman.  Patient did not want to elaborate or explain why he wanted to speak with the nurse.  CB# [phone]

## 2018-09-09 ENCOUNTER — Telehealth: Payer: Self-pay | Admitting: Family

## 2018-09-09 NOTE — Telephone Encounter (Signed)
Pt is due for follow up. Please contact pt to arrange OV.

## 2018-09-09 NOTE — Telephone Encounter (Signed)
Patient reports he is now seeing Dr. Geraldo Pitter at [place].

## 2019-01-06 NOTE — Patient Instructions (Addendum)
YOU NEED TO HAVE A COVID 19 TEST ON 01-15-2019 at 1000 am , THIS TEST MUST BE DONE BEFORE SURGERY, [address]  [address], [place] , [address]. ONCE YOUR COVID TEST IS COMPLETED, PLEASE BEGIN THE QUARANTINE INSTRUCTIONS AS OUTLINED IN YOUR HANDOUT.                Kenneth Watts    Your procedure is scheduled on: 01-19-2019   Report to [facility] Main  Entrance   Report to  [place] at 530 AM   1 VISITOR IS ALLOWED TO WAIT IN WAITING ROOM  ONLY DAY OF YOUR SURGERY. NO VISITORS ARE ALLOWED IN SHORT STAY OR RECOVERY ROOM.   Call this number if you have problems the morning of surgery [phone]    Remember: Kenneth Watts, NO CHEWING GUM CANDY OR MINTS.   BRING CPAP MASK AND TUBING   NO SOLID FOOD AFTER MIDNIGHT THE NIGHT PRIOR TO SURGERY. NOTHING BY MOUTH EXCEPT CLEAR LIQUIDS UNTIL 430 AM . PLEASE FINISH ENSURE DRINK PER SURGEON ORDER  WHICH NEEDS TO BE COMPLETED AT 430 AM .   CLEAR LIQUID DIET   Foods Allowed                                                                     Foods Excluded  Coffee and tea, regular and decaf                             liquids that you cannot  Plain Jell-O any favor except red or purple                                           see through such as: Fruit ices (not with fruit pulp)                                     milk, soups, orange juice  Iced Popsicles                                    All solid food Carbonated beverages, regular and diet                                    Cranberry, grape and apple juices Sports drinks like Gatorade Lightly seasoned clear broth or consume(fat free) Sugar, honey syrup  Sample Menu Breakfast                                Lunch                                     Supper Cranberry juice  Beef broth                            Chicken broth Jell-O                                     Grape juice                           Apple  juice Coffee or tea                        Jell-O                                      Popsicle                                                Coffee or tea                        Coffee or tea  _____________________________________________________________________     Take these medicines the morning of surgery with A SIP OF WATER:none                               You may not have any metal on your body including hair pins and              piercings  Do not wear jewelry, make-up, lotions, powders or perfumes, deodorant                         Men may shave face and neck.   Do not bring valuables to the hospital. Kenneth Watts.  Contacts, dentures or bridgework may not be worn into surgery.  Leave suitcase in the car. After surgery it may be brought to your room.     _____________________________________________________________________             [facility] - Preparing for Surgery Before surgery, you can play an important role.  Because skin is not sterile, your skin needs to be as free of germs as possible.  You can reduce the number of germs on your skin by washing with CHG (chlorahexidine gluconate) soap before surgery.  CHG is an antiseptic cleaner which kills germs and bonds with the skin to continue killing germs even after washing. Please DO NOT use if you have an allergy to CHG or antibacterial soaps.  If your skin becomes reddened/irritated stop using the CHG and inform your nurse when you arrive at Short Stay. Do not shave (including legs and underarms) for at least 48 hours prior to the first CHG shower.  You may shave your face/neck. Please follow these instructions carefully:  1.  Shower with CHG Soap the night before surgery and the  morning of Surgery.  2.  If you choose to wash your hair, wash your hair first as usual with your  normal  shampoo.  3.  After you shampoo, rinse your hair and body thoroughly to remove the   shampoo.                           4.  Use CHG as you would any other liquid soap.  You can apply chg directly  to the skin and wash                       Gently with a scrungie or clean washcloth.  5.  Apply the CHG Soap to your body ONLY FROM THE NECK DOWN.   Do not use on face/ open                           Wound or open sores. Avoid contact with eyes, ears mouth and genitals (private parts).                       Wash face,  Genitals (private parts) with your normal soap.             6.  Wash thoroughly, paying special attention to the area where your surgery  will be performed.  7.  Thoroughly rinse your body with warm water from the neck down.  8.  DO NOT shower/wash with your normal soap after using and rinsing off  the CHG Soap.                9.  Pat yourself dry with a clean towel.            10.  Wear clean pajamas.            11.  Place clean sheets on your bed the night of your first shower and do not  sleep with pets. Day of Surgery : Do not apply any lotions/deodorants the morning of surgery.  Please wear clean clothes to the hospital/surgery center.  FAILURE TO FOLLOW THESE INSTRUCTIONS MAY RESULT IN THE CANCELLATION OF YOUR SURGERY PATIENT SIGNATURE_________________________________  NURSE SIGNATURE__________________________________  ________________________________________________________________________   Kenneth Watts  An incentive spirometer is a tool that can help keep your lungs clear and active. This tool measures how well you are filling your lungs with each breath. Taking long deep breaths may help reverse or decrease the chance of developing breathing (pulmonary) problems (especially infection) following:  A long period of time when you are unable to move or be active. BEFORE THE PROCEDURE   If the spirometer includes an indicator to show your best effort, your nurse or respiratory therapist will set it to a desired goal.  If possible, sit up straight  or lean slightly forward. Try not to slouch.  Hold the incentive spirometer in an upright position. INSTRUCTIONS FOR USE  1. Sit on the edge of your bed if possible, or sit up as far as you can in bed or on a chair. 2. Hold the incentive spirometer in an upright position. 3. Breathe out normally. 4. Place the mouthpiece in your mouth and seal your lips tightly around it. 5. Breathe in slowly and as deeply as possible, raising the piston or the ball toward the top of the column. 6. Hold your breath for 3-5 seconds or for as long as possible. Allow the piston or ball to fall to the bottom of the column. 7. Remove the mouthpiece from your mouth and  breathe out normally. 8. Rest for a few seconds and repeat Steps 1 through 7 at least 10 times every 1-2 hours when you are awake. Take your time and take a few normal breaths between deep breaths. 9. The spirometer may include an indicator to show your best effort. Use the indicator as a goal to work toward during each repetition. 10. After each set of 10 deep breaths, practice coughing to be sure your lungs are clear. If you have an incision (the cut made at the time of surgery), support your incision when coughing by placing a pillow or rolled up towels firmly against it. Once you are able to get out of bed, walk around indoors and cough well. You may stop using the incentive spirometer when instructed by your caregiver.  RISKS AND COMPLICATIONS  Take your time so you do not get dizzy or light-headed.  If you are in pain, you may need to take or ask for pain medication before doing incentive spirometry. It is harder to take a deep breath if you are having pain. AFTER USE  Rest and breathe slowly and easily.  It can be helpful to keep track of a log of your progress. Your caregiver can provide you with a simple table to help with this. If you are using the spirometer at home, follow these instructions: Kenneth Watts IF:   You are having  difficultly using the spirometer.  You have trouble using the spirometer as often as instructed.  Your pain medication is not giving enough relief while using the spirometer.  You develop fever of 100.5 F (38.1 C) or higher. SEEK IMMEDIATE MEDICAL CARE IF:   You cough up bloody sputum that had not been present before.  You develop fever of 102 F (38.9 C) or greater.  You develop worsening pain at or near the incision site. MAKE SURE YOU:   Understand these instructions.  Will watch your condition.  Will get help right away if you are not doing well or get worse. Document Released: 09/16/2006 Document Revised: 07/29/2011 Document Reviewed: 11/17/2006 [facility] Patient Information 2014 [place], [place].   ________________________________________________________________________

## 2019-01-11 ENCOUNTER — Encounter (HOSPITAL_COMMUNITY)
Admission: RE | Admit: 2019-01-11 | Discharge: 2019-01-11 | Disposition: A | Discharge status: Home / Self Care | Payer: Medicare HMO | Source: Ambulatory Visit | Attending: Orthopedic Surgery | Admitting: Orthopedic Surgery

## 2019-01-11 ENCOUNTER — Encounter (HOSPITAL_COMMUNITY): Payer: Self-pay

## 2019-01-11 ENCOUNTER — Other Ambulatory Visit: Payer: Self-pay

## 2019-01-11 DIAGNOSIS — I10 Essential (primary) hypertension: Secondary | ICD-10-CM | POA: Insufficient documentation

## 2019-01-11 DIAGNOSIS — Z87891 Personal history of nicotine dependence: Secondary | ICD-10-CM | POA: Insufficient documentation

## 2019-01-11 DIAGNOSIS — Z79899 Other long term (current) drug therapy: Secondary | ICD-10-CM | POA: Diagnosis not present

## 2019-01-11 DIAGNOSIS — Z01818 Encounter for other preprocedural examination: Secondary | ICD-10-CM | POA: Insufficient documentation

## 2019-01-11 DIAGNOSIS — F209 Schizophrenia, unspecified: Secondary | ICD-10-CM | POA: Insufficient documentation

## 2019-01-11 DIAGNOSIS — M1612 Unilateral primary osteoarthritis, left hip: Secondary | ICD-10-CM | POA: Diagnosis not present

## 2019-01-11 DIAGNOSIS — J449 Chronic obstructive pulmonary disease, unspecified: Secondary | ICD-10-CM | POA: Insufficient documentation

## 2019-01-11 DIAGNOSIS — Z7982 Long term (current) use of aspirin: Secondary | ICD-10-CM | POA: Diagnosis not present

## 2019-01-11 DIAGNOSIS — Z85820 Personal history of malignant melanoma of skin: Secondary | ICD-10-CM | POA: Insufficient documentation

## 2019-01-11 DIAGNOSIS — G4733 Obstructive sleep apnea (adult) (pediatric): Secondary | ICD-10-CM | POA: Insufficient documentation

## 2019-01-11 DIAGNOSIS — Z9981 Dependence on supplemental oxygen: Secondary | ICD-10-CM | POA: Insufficient documentation

## 2019-01-11 DIAGNOSIS — F419 Anxiety disorder, unspecified: Secondary | ICD-10-CM | POA: Insufficient documentation

## 2019-01-11 HISTORY — DX: Anxiety disorder, unspecified: F41.9

## 2019-01-11 HISTORY — DX: Dependence on supplemental oxygen: Z99.81

## 2019-01-11 HISTORY — DX: Chronic obstructive pulmonary disease, unspecified: J44.9

## 2019-01-11 LAB — CBC
HCT: 46.2 % (ref 39.0–52.0)
Hemoglobin: 14.6 g/dL (ref 13.0–17.0)
MCH: 29.7 pg (ref 26.0–34.0)
MCHC: 31.6 g/dL (ref 30.0–36.0)
MCV: 94.1 fL (ref 80.0–100.0)
Platelets: 176 10*3/uL (ref 150–400)
RBC: 4.91 MIL/uL (ref 4.22–5.81)
RDW: 12.7 % (ref 11.5–15.5)
WBC: 6.6 10*3/uL (ref 4.0–10.5)
nRBC: 0 % (ref 0.0–0.2)

## 2019-01-11 LAB — BASIC METABOLIC PANEL
Anion gap: 9 (ref 5–15)
BUN: 23 mg/dL (ref 8–23)
CO2: 29 mmol/L (ref 22–32)
Calcium: 9.3 mg/dL (ref 8.9–10.3)
Chloride: 103 mmol/L (ref 98–111)
Creatinine, Ser: 1.25 mg/dL — ABNORMAL HIGH (ref 0.61–1.24)
GFR calc Af Amer: >60 mL/min (ref >60)
GFR calc non Af Amer: 56 mL/min — ABNORMAL LOW (ref >60)
Glucose, Bld: 120 mg/dL — ABNORMAL HIGH (ref 70–99)
Potassium: 4.2 mmol/L (ref 3.5–5.1)
Sodium: 141 mmol/L (ref 135–145)

## 2019-01-11 LAB — SURGICAL PCR SCREEN
MRSA, PCR: NEGATIVE
Staphylococcus aureus: NEGATIVE

## 2019-01-11 NOTE — Progress Notes (Signed)
PCP - [facility] medical clearance note on chart dated 10-16-2018 Cardiologist - dr Ubaldo Glassing cardiology clearance 11-29-18 on chart Pulmonology dr Raul Del 11-11-18 on chart  Chest x-ray - 11-17-18 [facility] EKG - 11-17-18 dr Donley Redder dsa on chart Stress Test - chemical stress test 12-16-17 [facility] ECHO -  Cardiac Cath -   Sleep Study -  CPAP -   Fasting Blood Sugar -  Checks Blood Sugar _____ times a day  Blood Thinner Instructions: Aspirin Instructions: aspirin 81 mg  Last Dose: do not stop per dr Mardelle Matte instructions  Anesthesia review:   Patient denies shortness of breath, fever, cough and chest pain at PAT appointment   Patient verbalized understanding of instructions that were given to them at the PAT appointment. Patient was also instructed that they will need to review over the PAT instructions again at home before surgery.

## 2019-01-13 NOTE — Progress Notes (Signed)
Anesthesia Chart Review   Case: [id number] Date/Time: 01/19/19 0715   Procedure: TOTAL HIP ARTHROPLASTY (Left )   Anesthesia type: Choice   Pre-op diagnosis: OA LEFT HIP   Location: [facility] OR[facility]   Surgeon: Marchia Bond, MD      DISCUSSION:73 y.o. former smoker (19 pack years, quit 05/20/84) with h/o HTN, COPD, O2 dependent on 3 liters w/cpap at night, anxiety, schizophrenia, OA left hip scheduled for above procedure 01/19/2019 with Dr. Marchia Bond.   Pt seen by PCP for preoperative evaluation 11/17/2018.  Per OV note, "73 yo male with planned surgery as above.  Known risk factors for perioperative complications: None.  Difficulty with intubation is not anticipated.  Cardiac Risk Estimation: Moderate."  PCP recommended surgeon repeat PTT 1-2 weeks prior to surgery due to slightly elevated value of 35 on 11/17/2018.    Clearance received from cardiologist, Dr. Ubaldo Glassing, 10/21/2018 which states pt is cleared for surgery from a cardiac standpoint only.  Last seen by pulmonologist 11/11/2018.  Per OV note pt is quite stable, advised to stay on anoro and prn albuterol, hold asa 3-4 days prior to procedure, follow up in 6 monthsn.  Clearance received from pulmonologist, Dr. Wallene Huh, which states pt is cleared from a pulmonary standpoint only.   Anticipate pt can proceed with planned procedure barring acute status change.   VS: BP (!) 145/80   Pulse 77   Temp 36.7 C (Oral)   Resp 18   Ht 6' (1.829 m)   Wt 118.8 kg   SpO2 98%   BMI 35.53 kg/m   PROVIDERS: Dsa, Mayford Knife, MD is PCP   Bartholome Bill, MD is Cardiologist  Wallene Huh, MD is Pulmonologist  LABS: Labs reviewed: Acceptable for surgery. 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 The overall quality of the study is good.  Artifacts noted: no Left ventricular cavity: normal.  Perfusion Analysis: SPECT images demonstrate small perfusion abnormality  of mild intensity is present in the area region on the stress images.   Echo 03/22/2016 INTERPRETATION NORMAL RIGHT VENTRICULAR SYSTOLIC FUNCTION MILD VALVULAR REGURGITATION (See above) NO VALVULAR STENOSIS NORMAL LEFT VENTRICULAR SYSTOLIC FUNCTION Closest EF: >55% (Estimated) LVH: MODERATE LVH Size: MILDLY ENLARGED Size: MODERATELY ENLARGED Very Limited views due to poor sound wave transmission. Past Medical History:  Diagnosis Date  . Anxiety   . Avascular necrosis of bones of both hips ([place])   . COPD (chronic obstructive pulmonary disease) ([facility])    no inhalers used  . Diverticulitis    no recent problems  . Hypertension   . Melanoma ([place]) 2014   left arm  . OSA (obstructive sleep apnea)   . Oxygen dependent    3 liters with cpap set on 5 at hs  . Schizophrenia ([place])    "paranoid schizopherneic with serois anger issues,  3 personalities, multiple psych issues"    Past Surgical History:  Procedure Laterality Date  . CHOLECYSTECTOMY    . HERNIA REPAIR     chest  . MELANOMA EXCISION Left 10/01/12   arm    MEDICATIONS: . aspirin 81 MG chewable tablet  . clomiPHENE (CLOMID) 50 MG tablet  . levocetirizine (XYZAL) 5 MG tablet  . losartan-hydrochlorothiazide (HYZAAR) 50-12.5 MG tablet  . LUTEIN PO  . Multiple Vitamin (MULTIVITAMIN) tablet  . QUEtiapine (SEROQUEL) 50 MG tablet  . Resveratrol 100 MG CAPS  . ziprasidone (GEODON) 60 MG capsule   . ziprasidone (GEODON) 80 MG capsule   No current facility-administered medications for this encounter.    Maia Plan [facility] Pre-Surgical Testing [phone] 01/14/19  10:45 AM

## 2019-01-14 NOTE — Anesthesia Preprocedure Evaluation (Addendum)
Anesthesia Evaluation  Patient identified by MRN, date of birth, ID band Patient awake    Reviewed: Allergy & Precautions, NPO status , Patient's Chart, lab work & pertinent test results  Airway Mallampati: I  TM Distance: >3 FB Neck ROM: Full  Mouth opening: Limited Mouth Opening  Dental no notable dental hx. 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 The overall quality of the study is good.  Artifacts noted: no Left ventricular cavity: normal. Perfusion Analysis: SPECT images demonstrate small perfusion abnormality of mild intensity is present in the area region on the stress images.   Echo 03/22/2016 INTERPRETATION NORMAL RIGHT VENTRICULAR SYSTOLIC FUNCTION MILD VALVULAR REGURGITATION (See above) NO VALVULAR STENOSIS NORMAL LEFT VENTRICULAR SYSTOLIC FUNCTION Closest EF: >55% (Estimated) LVH: MODERATE LVH Size: MILDLY ENLARGED Size: MODERATELY ENLARGED Very Limited views due to poor sound wave transmission.   Neuro/Psych PSYCHIATRIC DISORDERS Anxiety Schizophrenia negative neurological ROS     GI/Hepatic negative GI ROS, Neg liver ROS,   Endo/Other  negative endocrine ROS  Renal/GU negative Renal ROS  negative genitourinary   Musculoskeletal  (+) Arthritis ,   Abdominal   Peds  Hematology negative hematology ROS (+)   Anesthesia Other Findings   Reproductive/Obstetrics                            Anesthesia Physical Anesthesia Plan  ASA: III  Anesthesia Plan: Spinal   Post-op Pain Management:    Induction:   PONV Risk Score and Plan: 1 and Treatment may vary due to age or medical condition, Propofol infusion, Ondansetron and Dexamethasone  Airway Management Planned: Natural Airway  Additional Equipment:   Intra-op Plan:   Post-operative Plan:   Informed Consent: I have reviewed the patients History and Physical, chart, labs and discussed the procedure including the risks, benefits and alternatives for the proposed anesthesia with the patient or authorized representative who has indicated his/her understanding and acceptance.     Dental advisory given  Plan Discussed with: CRNA  Anesthesia Plan Comments:       Anesthesia Quick Evaluation

## 2019-01-15 ENCOUNTER — Other Ambulatory Visit (HOSPITAL_COMMUNITY)
Admission: RE | Admit: 2019-01-15 | Discharge: 2019-01-15 | Disposition: A | Discharge status: Home / Self Care | Payer: Medicare HMO | Source: Ambulatory Visit | Attending: Orthopedic Surgery | Admitting: Orthopedic Surgery

## 2019-01-15 DIAGNOSIS — Z01812 Encounter for preprocedural laboratory examination: Secondary | ICD-10-CM | POA: Diagnosis present

## 2019-01-15 DIAGNOSIS — Z20828 Contact with and (suspected) exposure to other viral communicable diseases: Secondary | ICD-10-CM | POA: Diagnosis not present

## 2019-01-15 LAB — SARS CORONAVIRUS 2 (TAT 6-24 HRS): SARS Coronavirus 2: NEGATIVE

## 2019-01-19 ENCOUNTER — Encounter (HOSPITAL_COMMUNITY)
Admission: RE | Disposition: A | Discharge status: Skilled Nursing Facility | Payer: Self-pay | Source: Other Acute Inpatient Hospital | Attending: Orthopedic Surgery

## 2019-01-19 ENCOUNTER — Inpatient Hospital Stay (HOSPITAL_COMMUNITY): Payer: Medicare HMO | Admitting: Physician Assistant

## 2019-01-19 ENCOUNTER — Inpatient Hospital Stay (HOSPITAL_COMMUNITY): Payer: Medicare HMO

## 2019-01-19 ENCOUNTER — Other Ambulatory Visit: Payer: Self-pay

## 2019-01-19 ENCOUNTER — Encounter (HOSPITAL_COMMUNITY): Payer: Self-pay | Admitting: *Deleted

## 2019-01-19 ENCOUNTER — Inpatient Hospital Stay (HOSPITAL_COMMUNITY): Payer: Medicare HMO | Admitting: Certified Registered"

## 2019-01-19 ENCOUNTER — Inpatient Hospital Stay (HOSPITAL_COMMUNITY)
Admission: RE | Admit: 2019-01-19 | Discharge: 2019-01-27 | DRG: 470 | Disposition: A | Discharge status: Skilled Nursing Facility | Payer: Medicare HMO | Source: Other Acute Inpatient Hospital | Attending: Orthopedic Surgery | Admitting: Orthopedic Surgery

## 2019-01-19 DIAGNOSIS — M25752 Osteophyte, left hip: Secondary | ICD-10-CM | POA: Diagnosis present

## 2019-01-19 DIAGNOSIS — Z8249 Family history of ischemic heart disease and other diseases of the circulatory system: Secondary | ICD-10-CM | POA: Diagnosis not present

## 2019-01-19 DIAGNOSIS — Z888 Allergy status to other drugs, medicaments and biological substances status: Secondary | ICD-10-CM | POA: Diagnosis not present

## 2019-01-19 DIAGNOSIS — Z96649 Presence of unspecified artificial hip joint: Secondary | ICD-10-CM

## 2019-01-19 DIAGNOSIS — F209 Schizophrenia, unspecified: Secondary | ICD-10-CM | POA: Diagnosis present

## 2019-01-19 DIAGNOSIS — I10 Essential (primary) hypertension: Secondary | ICD-10-CM | POA: Diagnosis present

## 2019-01-19 DIAGNOSIS — Z87891 Personal history of nicotine dependence: Secondary | ICD-10-CM | POA: Diagnosis not present

## 2019-01-19 DIAGNOSIS — Z9049 Acquired absence of other specified parts of digestive tract: Secondary | ICD-10-CM | POA: Diagnosis not present

## 2019-01-19 DIAGNOSIS — Z79899 Other long term (current) drug therapy: Secondary | ICD-10-CM

## 2019-01-19 DIAGNOSIS — Z9981 Dependence on supplemental oxygen: Secondary | ICD-10-CM

## 2019-01-19 DIAGNOSIS — M879 Osteonecrosis, unspecified: Secondary | ICD-10-CM | POA: Diagnosis present

## 2019-01-19 DIAGNOSIS — Z20828 Contact with and (suspected) exposure to other viral communicable diseases: Secondary | ICD-10-CM | POA: Diagnosis present

## 2019-01-19 DIAGNOSIS — Z85820 Personal history of malignant melanoma of skin: Secondary | ICD-10-CM | POA: Diagnosis not present

## 2019-01-19 DIAGNOSIS — Z7982 Long term (current) use of aspirin: Secondary | ICD-10-CM | POA: Diagnosis not present

## 2019-01-19 DIAGNOSIS — J449 Chronic obstructive pulmonary disease, unspecified: Secondary | ICD-10-CM | POA: Diagnosis present

## 2019-01-19 DIAGNOSIS — F419 Anxiety disorder, unspecified: Secondary | ICD-10-CM | POA: Diagnosis present

## 2019-01-19 DIAGNOSIS — M1612 Unilateral primary osteoarthritis, left hip: Principal | ICD-10-CM | POA: Diagnosis present

## 2019-01-19 DIAGNOSIS — G4733 Obstructive sleep apnea (adult) (pediatric): Secondary | ICD-10-CM | POA: Diagnosis present

## 2019-01-19 HISTORY — DX: Unilateral primary osteoarthritis, left hip: M16.12

## 2019-01-19 HISTORY — PX: TOTAL HIP ARTHROPLASTY: SHX124

## 2019-01-19 LAB — PROTIME-INR
INR: 1 (ref 0.8–1.2)
Prothrombin Time: 13.5 seconds (ref 11.4–15.2)

## 2019-01-19 LAB — APTT: aPTT: 35 seconds (ref 24–36)

## 2019-01-19 SURGERY — ARTHROPLASTY, HIP, TOTAL,POSTERIOR APPROACH
Anesthesia: Spinal | Site: Hip | Laterality: Left

## 2019-01-19 MED ORDER — BISACODYL 10 MG RE SUPP: 10.0000 mg | Freq: Every day | RECTAL | Status: DC | PRN

## 2019-01-19 MED ORDER — PROPOFOL 10 MG/ML IV BOLUS
INTRAVENOUS | Status: AC
  Filled 2019-01-19: qty 60

## 2019-01-19 MED ORDER — EPHEDRINE SULFATE-NACL 50-0.9 MG/10ML-% IV SOSY
PREFILLED_SYRINGE | INTRAVENOUS | Status: DC | PRN
  Administered 2019-01-19: 10 mg via INTRAVENOUS

## 2019-01-19 MED ORDER — PROPOFOL 10 MG/ML IV BOLUS
INTRAVENOUS | Status: AC
  Filled 2019-01-19: qty 20

## 2019-01-19 MED ORDER — ALUM & MAG HYDROXIDE-SIMETH 200-200-20 MG/5ML PO SUSP
30.0000 mL | ORAL | Status: DC | PRN
  Filled 2019-01-19: qty 30

## 2019-01-19 MED ORDER — ONDANSETRON HCL 4 MG/2ML IJ SOLN: 4.0000 mg | Freq: Four times a day (QID) | INTRAMUSCULAR | Status: DC | PRN

## 2019-01-19 MED ORDER — SENNA-DOCUSATE SODIUM 8.6-50 MG PO TABS: 2.0000 | ORAL_TABLET | Freq: Every day | ORAL | 1 refills | Status: DC

## 2019-01-19 MED ORDER — DEXAMETHASONE SODIUM PHOSPHATE 10 MG/ML IJ SOLN
INTRAMUSCULAR | Status: DC | PRN
  Administered 2019-01-19: 10 mg via INTRAVENOUS

## 2019-01-19 MED ORDER — METHOCARBAMOL 500 MG IVPB - SIMPLE MED
500.0000 mg | Freq: Four times a day (QID) | INTRAVENOUS | Status: DC | PRN
  Filled 2019-01-19: qty 50

## 2019-01-19 MED ORDER — FENTANYL CITRATE (PF) 100 MCG/2ML IJ SOLN
INTRAMUSCULAR | Status: AC
  Filled 2019-01-19: qty 2

## 2019-01-19 MED ORDER — MAGNESIUM CITRATE PO SOLN: 1.0000 | Freq: Once | ORAL | Status: DC | PRN

## 2019-01-19 MED ORDER — ONDANSETRON HCL 4 MG PO TABS: 4.0000 mg | ORAL_TABLET | Freq: Four times a day (QID) | ORAL | Status: DC | PRN

## 2019-01-19 MED ORDER — QUETIAPINE FUMARATE 50 MG PO TABS
50.0000 mg | ORAL_TABLET | Freq: Every day | ORAL | Status: DC
  Administered 2019-01-19 – 2019-01-26 (×8): 50 mg via ORAL
  Filled 2019-01-19 (×8): qty 1

## 2019-01-19 MED ORDER — HYDROCHLOROTHIAZIDE 12.5 MG PO CAPS
12.5000 mg | ORAL_CAPSULE | Freq: Every day | ORAL | Status: DC
  Administered 2019-01-19 – 2019-01-27 (×9): 12.5 mg via ORAL
  Filled 2019-01-19 (×9): qty 1

## 2019-01-19 MED ORDER — STERILE WATER FOR IRRIGATION IR SOLN
Status: DC | PRN
  Administered 2019-01-19: 2000 mL

## 2019-01-19 MED ORDER — MIDAZOLAM HCL 2 MG/2ML IJ SOLN
INTRAMUSCULAR | Status: AC
  Filled 2019-01-19: qty 2

## 2019-01-19 MED ORDER — ONDANSETRON HCL 4 MG PO TABS: 4.0000 mg | ORAL_TABLET | Freq: Three times a day (TID) | ORAL | 0 refills | Status: DC | PRN

## 2019-01-19 MED ORDER — LOSARTAN POTASSIUM-HCTZ 50-12.5 MG PO TABS: 1.0000 | ORAL_TABLET | Freq: Every day | ORAL | Status: DC

## 2019-01-19 MED ORDER — LEVOCETIRIZINE DIHYDROCHLORIDE 5 MG PO TABS: 5.0000 mg | ORAL_TABLET | Freq: Every evening | ORAL | Status: DC

## 2019-01-19 MED ORDER — FENTANYL CITRATE (PF) 100 MCG/2ML IJ SOLN: 25.0000 ug | INTRAMUSCULAR | Status: DC | PRN

## 2019-01-19 MED ORDER — DEXAMETHASONE SODIUM PHOSPHATE 10 MG/ML IJ SOLN
INTRAMUSCULAR | Status: AC
  Filled 2019-01-19: qty 1

## 2019-01-19 MED ORDER — DEXAMETHASONE SODIUM PHOSPHATE 10 MG/ML IJ SOLN
10.0000 mg | Freq: Once | INTRAMUSCULAR | Status: AC
  Administered 2019-01-20: 10:00:00 10 mg via INTRAVENOUS
  Filled 2019-01-19: qty 1

## 2019-01-19 MED ORDER — PHENOL 1.4 % MT LIQD: 1.0000 | OROMUCOSAL | Status: DC | PRN

## 2019-01-19 MED ORDER — DOCUSATE SODIUM 100 MG PO CAPS
100.0000 mg | ORAL_CAPSULE | Freq: Two times a day (BID) | ORAL | Status: DC
  Administered 2019-01-19 – 2019-01-27 (×15): 100 mg via ORAL
  Filled 2019-01-19 (×16): qty 1

## 2019-01-19 MED ORDER — CEFAZOLIN SODIUM-DEXTROSE 2-4 GM/100ML-% IV SOLN
2.0000 g | INTRAVENOUS | Status: AC
  Administered 2019-01-19: 2 g via INTRAVENOUS
  Filled 2019-01-19: qty 100

## 2019-01-19 MED ORDER — RESVERATROL 100 MG PO CAPS: 100.0000 mg | ORAL_CAPSULE | Freq: Every day | ORAL | Status: DC

## 2019-01-19 MED ORDER — CLOMIPHENE CITRATE 50 MG PO TABS: 25.0000 mg | ORAL_TABLET | Freq: Every evening | ORAL | Status: DC

## 2019-01-19 MED ORDER — BUPIVACAINE HCL (PF) 0.25 % IJ SOLN
INTRAMUSCULAR | Status: DC | PRN
  Administered 2019-01-19: 30 mL

## 2019-01-19 MED ORDER — CEFAZOLIN SODIUM-DEXTROSE 2-4 GM/100ML-% IV SOLN
2.0000 g | Freq: Four times a day (QID) | INTRAVENOUS | Status: AC
  Administered 2019-01-19 (×2): 2 g via INTRAVENOUS
  Filled 2019-01-19 (×2): qty 100

## 2019-01-19 MED ORDER — METHOCARBAMOL 500 MG PO TABS
500.0000 mg | ORAL_TABLET | Freq: Four times a day (QID) | ORAL | Status: DC | PRN
  Administered 2019-01-19 – 2019-01-25 (×6): 500 mg via ORAL
  Filled 2019-01-19 (×6): qty 1

## 2019-01-19 MED ORDER — SODIUM CHLORIDE 0.9 % IV SOLN
INTRAVENOUS | Status: DC | PRN
  Administered 2019-01-19: 40 ug/min via INTRAVENOUS

## 2019-01-19 MED ORDER — POLYETHYLENE GLYCOL 3350 17 G PO PACK
17.0000 g | PACK | Freq: Every day | ORAL | Status: DC | PRN
  Administered 2019-01-22: 17 g via ORAL
  Filled 2019-01-19: qty 1

## 2019-01-19 MED ORDER — HYDROCODONE-ACETAMINOPHEN 7.5-325 MG PO TABS
1.0000 | ORAL_TABLET | ORAL | Status: DC | PRN
  Filled 2019-01-19: qty 1

## 2019-01-19 MED ORDER — LACTATED RINGERS IV SOLN
INTRAVENOUS | Status: DC
  Administered 2019-01-19: 06:00:00 via INTRAVENOUS

## 2019-01-19 MED ORDER — FENTANYL CITRATE (PF) 100 MCG/2ML IJ SOLN
INTRAMUSCULAR | Status: DC | PRN
  Administered 2019-01-19: 50 ug via INTRAVENOUS

## 2019-01-19 MED ORDER — ASPIRIN EC 325 MG PO TBEC
325.0000 mg | DELAYED_RELEASE_TABLET | Freq: Two times a day (BID) | ORAL | Status: DC
  Administered 2019-01-19 – 2019-01-27 (×16): 325 mg via ORAL
  Filled 2019-01-19 (×16): qty 1

## 2019-01-19 MED ORDER — ADULT MULTIVITAMIN W/MINERALS CH
1.0000 | ORAL_TABLET | Freq: Every day | ORAL | Status: DC
  Administered 2019-01-20 – 2019-01-27 (×8): 1 via ORAL
  Filled 2019-01-19 (×8): qty 1

## 2019-01-19 MED ORDER — ZIPRASIDONE HCL 80 MG PO CAPS
80.0000 mg | ORAL_CAPSULE | Freq: Every evening | ORAL | Status: DC
  Administered 2019-01-19 – 2019-01-26 (×8): 80 mg via ORAL
  Filled 2019-01-19 (×9): qty 1

## 2019-01-19 MED ORDER — TRANEXAMIC ACID-NACL 1000-0.7 MG/100ML-% IV SOLN
1000.0000 mg | INTRAVENOUS | Status: AC
  Administered 2019-01-19: 08:00:00 1000 mg via INTRAVENOUS
  Filled 2019-01-19: qty 100

## 2019-01-19 MED ORDER — LORATADINE 10 MG PO TABS
10.0000 mg | ORAL_TABLET | Freq: Every evening | ORAL | Status: DC
  Administered 2019-01-19 – 2019-01-26 (×8): 10 mg via ORAL
  Filled 2019-01-19 (×8): qty 1

## 2019-01-19 MED ORDER — ZIPRASIDONE HCL 60 MG PO CAPS
60.0000 mg | ORAL_CAPSULE | Freq: Every evening | ORAL | Status: DC
  Administered 2019-01-19 – 2019-01-26 (×8): 60 mg via ORAL
  Filled 2019-01-19 (×9): qty 1

## 2019-01-19 MED ORDER — KETOROLAC TROMETHAMINE 15 MG/ML IJ SOLN
7.5000 mg | Freq: Four times a day (QID) | INTRAMUSCULAR | Status: AC
  Administered 2019-01-19 – 2019-01-20 (×4): 7.5 mg via INTRAVENOUS
  Filled 2019-01-19 (×4): qty 1

## 2019-01-19 MED ORDER — BUPIVACAINE HCL (PF) 0.25 % IJ SOLN
INTRAMUSCULAR | Status: AC
  Filled 2019-01-19: qty 30

## 2019-01-19 MED ORDER — ZOLPIDEM TARTRATE 5 MG PO TABS: 5.0000 mg | ORAL_TABLET | Freq: Every evening | ORAL | Status: DC | PRN

## 2019-01-19 MED ORDER — METOCLOPRAMIDE HCL 5 MG PO TABS: 5.0000 mg | ORAL_TABLET | Freq: Three times a day (TID) | ORAL | Status: DC | PRN

## 2019-01-19 MED ORDER — PHENYLEPHRINE HCL (PRESSORS) 10 MG/ML IV SOLN
INTRAVENOUS | Status: AC
  Filled 2019-01-19: qty 1

## 2019-01-19 MED ORDER — KETOROLAC TROMETHAMINE 30 MG/ML IJ SOLN
INTRAMUSCULAR | Status: DC | PRN
  Administered 2019-01-19: 30 mg via INTRAMUSCULAR

## 2019-01-19 MED ORDER — 0.9 % SODIUM CHLORIDE (POUR BTL) OPTIME
TOPICAL | Status: DC | PRN
  Administered 2019-01-19: 1000 mL

## 2019-01-19 MED ORDER — ONDANSETRON HCL 4 MG/2ML IJ SOLN
INTRAMUSCULAR | Status: AC
  Filled 2019-01-19: qty 2

## 2019-01-19 MED ORDER — MIDAZOLAM HCL 5 MG/5ML IJ SOLN
INTRAMUSCULAR | Status: DC | PRN
  Administered 2019-01-19 (×2): 1 mg via INTRAVENOUS

## 2019-01-19 MED ORDER — PROPOFOL 500 MG/50ML IV EMUL
INTRAVENOUS | Status: DC | PRN
  Administered 2019-01-19: 50 ug/kg/min via INTRAVENOUS

## 2019-01-19 MED ORDER — PROPOFOL 10 MG/ML IV BOLUS
INTRAVENOUS | Status: DC | PRN
  Administered 2019-01-19: 20 mg via INTRAVENOUS

## 2019-01-19 MED ORDER — ACETAMINOPHEN 325 MG PO TABS
325.0000 mg | ORAL_TABLET | Freq: Four times a day (QID) | ORAL | Status: DC | PRN
  Administered 2019-01-20: 18:00:00 650 mg via ORAL
  Filled 2019-01-19: qty 2

## 2019-01-19 MED ORDER — MORPHINE SULFATE (PF) 4 MG/ML IV SOLN: 0.5000 mg | INTRAVENOUS | Status: DC | PRN

## 2019-01-19 MED ORDER — DIPHENHYDRAMINE HCL 12.5 MG/5ML PO ELIX: 12.5000 mg | ORAL_SOLUTION | ORAL | Status: DC | PRN

## 2019-01-19 MED ORDER — EPHEDRINE 5 MG/ML INJ
INTRAVENOUS | Status: AC
  Filled 2019-01-19: qty 10

## 2019-01-19 MED ORDER — ACETAMINOPHEN 500 MG PO TABS
500.0000 mg | ORAL_TABLET | Freq: Four times a day (QID) | ORAL | Status: AC
  Administered 2019-01-19 – 2019-01-20 (×3): 500 mg via ORAL
  Filled 2019-01-19 (×3): qty 1

## 2019-01-19 MED ORDER — HYDROCODONE-ACETAMINOPHEN 5-325 MG PO TABS
1.0000 | ORAL_TABLET | ORAL | Status: DC | PRN
  Administered 2019-01-20: 1 via ORAL
  Administered 2019-01-21 (×2): 2 via ORAL
  Administered 2019-01-21 (×2): 1 via ORAL
  Administered 2019-01-21 – 2019-01-25 (×13): 2 via ORAL
  Filled 2019-01-19 (×3): qty 2
  Filled 2019-01-19: qty 1
  Filled 2019-01-19 (×7): qty 2
  Filled 2019-01-19: qty 1
  Filled 2019-01-19: qty 2
  Filled 2019-01-19: qty 1
  Filled 2019-01-19 (×5): qty 2

## 2019-01-19 MED ORDER — PHENYLEPHRINE 40 MCG/ML (10ML) SYRINGE FOR IV PUSH (FOR BLOOD PRESSURE SUPPORT)
PREFILLED_SYRINGE | INTRAVENOUS | Status: DC | PRN
  Administered 2019-01-19 (×2): 120 ug via INTRAVENOUS

## 2019-01-19 MED ORDER — LOSARTAN POTASSIUM 50 MG PO TABS
50.0000 mg | ORAL_TABLET | Freq: Every day | ORAL | Status: DC
  Administered 2019-01-19 – 2019-01-27 (×9): 50 mg via ORAL
  Filled 2019-01-19 (×9): qty 1

## 2019-01-19 MED ORDER — MENTHOL 3 MG MT LOZG: 1.0000 | LOZENGE | OROMUCOSAL | Status: DC | PRN

## 2019-01-19 MED ORDER — LIDOCAINE 2% (20 MG/ML) 5 ML SYRINGE
INTRAMUSCULAR | Status: DC | PRN
  Administered 2019-01-19: 60 mg via INTRAVENOUS

## 2019-01-19 MED ORDER — BUPIVACAINE IN DEXTROSE 0.75-8.25 % IT SOLN
INTRATHECAL | Status: DC | PRN
  Administered 2019-01-19: 2 mL via INTRATHECAL

## 2019-01-19 MED ORDER — ASPIRIN EC 325 MG PO TBEC: 325.0000 mg | DELAYED_RELEASE_TABLET | Freq: Two times a day (BID) | ORAL | 0 refills | Status: DC

## 2019-01-19 MED ORDER — BACLOFEN 10 MG PO TABS: 10.0000 mg | ORAL_TABLET | Freq: Three times a day (TID) | ORAL | 0 refills | Status: DC

## 2019-01-19 MED ORDER — KETOROLAC TROMETHAMINE 30 MG/ML IJ SOLN
INTRAMUSCULAR | Status: AC
  Filled 2019-01-19: qty 1

## 2019-01-19 MED ORDER — ACETAMINOPHEN 500 MG PO TABS: 1000.0000 mg | ORAL_TABLET | Freq: Once | ORAL | Status: DC

## 2019-01-19 MED ORDER — ACETAMINOPHEN 500 MG PO TABS
1000.0000 mg | ORAL_TABLET | Freq: Once | ORAL | Status: AC
  Administered 2019-01-19: 06:00:00 1000 mg via ORAL
  Filled 2019-01-19: qty 2

## 2019-01-19 MED ORDER — METOCLOPRAMIDE HCL 5 MG/ML IJ SOLN: 5.0000 mg | Freq: Three times a day (TID) | INTRAMUSCULAR | Status: DC | PRN

## 2019-01-19 MED ORDER — PHENYLEPHRINE 40 MCG/ML (10ML) SYRINGE FOR IV PUSH (FOR BLOOD PRESSURE SUPPORT)
PREFILLED_SYRINGE | INTRAVENOUS | Status: AC
  Filled 2019-01-19: qty 10

## 2019-01-19 MED ORDER — POVIDONE-IODINE 10 % EX SWAB
2.0000 "application " | Freq: Once | CUTANEOUS | Status: AC
  Administered 2019-01-19: 2 via TOPICAL

## 2019-01-19 MED ORDER — ONDANSETRON HCL 4 MG/2ML IJ SOLN
INTRAMUSCULAR | Status: DC | PRN
  Administered 2019-01-19: 4 mg via INTRAVENOUS

## 2019-01-19 MED ORDER — TRANEXAMIC ACID-NACL 1000-0.7 MG/100ML-% IV SOLN
1000.0000 mg | Freq: Once | INTRAVENOUS | Status: AC
  Administered 2019-01-19: 12:00:00 1000 mg via INTRAVENOUS
  Filled 2019-01-19: qty 100

## 2019-01-19 MED ORDER — HYDROCODONE-ACETAMINOPHEN 10-325 MG PO TABS: 1.0000 | ORAL_TABLET | Freq: Four times a day (QID) | ORAL | 0 refills | Status: DC | PRN

## 2019-01-19 MED ORDER — POTASSIUM CHLORIDE IN NACL 20-0.45 MEQ/L-% IV SOLN
INTRAVENOUS | Status: DC
  Administered 2019-01-19 – 2019-01-20 (×2): via INTRAVENOUS
  Filled 2019-01-19 (×3): qty 1000

## 2019-01-19 SURGICAL SUPPLY — 57 items
BIT DRILL 2.0X128 (BIT) ×2 IMPLANT
BLADE SAW SGTL 73X25 THK (BLADE) ×2 IMPLANT
BLADE SURG SZ10 CARB STEEL (BLADE) ×4 IMPLANT
CLSR STERI-STRIP ANTIMIC 1/2X4 (GAUZE/BANDAGES/DRESSINGS) ×4 IMPLANT
COVER SURGICAL LIGHT HANDLE (MISCELLANEOUS) ×2 IMPLANT
COVER WAND RF STERILE (DRAPES) ×2 IMPLANT
CUP SECTOR GRIPTON 58MM (Orthopedic Implant) ×2 IMPLANT
DRAPE INCISE IOBAN 66X45 STRL (DRAPES) ×4 IMPLANT
DRAPE ORTHO SPLIT 77X108 STRL (DRAPES) ×2
DRAPE POUCH INSTRU U-SHP 10X18 (DRAPES) ×2 IMPLANT
DRAPE SHEET LG 3/4 BI-LAMINATE (DRAPES) ×2 IMPLANT
DRAPE SURG 17X11 SM STRL (DRAPES) ×2 IMPLANT
DRAPE SURG ORHT 6 SPLT 77X108 (DRAPES) ×2 IMPLANT
DRAPE U-SHAPE 47X51 STRL (DRAPES) ×2 IMPLANT
DRSG MEPILEX BORDER 4X8 (GAUZE/BANDAGES/DRESSINGS) ×2 IMPLANT
DURAPREP 26ML APPLICATOR (WOUND CARE) ×4 IMPLANT
ELECT BLADE TIP CTD 4 INCH (ELECTRODE) ×2 IMPLANT
ELECT REM PT RETURN 15FT ADLT (MISCELLANEOUS) ×2 IMPLANT
ELIMINATOR HOLE APEX DEPUY (Hips) ×2 IMPLANT
GLOVE BIO SURGEON STRL SZ7.5 (GLOVE) ×2 IMPLANT
GLOVE BIO SURGEON STRL SZ8 (GLOVE) ×2 IMPLANT
GLOVE BIOGEL PI IND STRL 8 (GLOVE) ×2 IMPLANT
GLOVE BIOGEL PI INDICATOR 8 (GLOVE) ×2
GOWN STRL REUS W/TWL 2XL LVL3 (GOWN DISPOSABLE) ×2 IMPLANT
GOWN STRL REUS W/TWL LRG LVL3 (GOWN DISPOSABLE) ×2 IMPLANT
HEAD M SROM 36MM PLUS 1.5 (Hips) ×1 IMPLANT
HOOD PEEL AWAY FLYTE STAYCOOL (MISCELLANEOUS) ×6 IMPLANT
KIT BASIN OR (CUSTOM PROCEDURE TRAY) ×2 IMPLANT
KIT TURNOVER KIT A (KITS) IMPLANT
LINER NEUTRAL 36X58 PLUS4 ×2 IMPLANT
MANIFOLD NEPTUNE II (INSTRUMENTS) ×2 IMPLANT
NDL SAFETY ECLIPSE 18X1.5 (NEEDLE) ×1 IMPLANT
NEEDLE HYPO 18GX1.5 SHARP (NEEDLE) ×1
NEEDLE HYPO 22GX1.5 SAFETY (NEEDLE) ×2 IMPLANT
NS IRRIG 1000ML POUR BTL (IV SOLUTION) ×2 IMPLANT
PACK TOTAL JOINT (CUSTOM PROCEDURE TRAY) ×2 IMPLANT
PROTECTOR NERVE ULNAR (MISCELLANEOUS) ×2 IMPLANT
RETRIEVER SUT HEWSON (MISCELLANEOUS) ×2 IMPLANT
SCREW 6.5MMX25MM (Screw) ×2 IMPLANT
SROM M HEAD 36MM PLUS 1.5 (Hips) ×2 IMPLANT
STEM HIP W/DUOFIX (Stem) ×2 IMPLANT
SUCTION FRAZIER HANDLE 12FR (TUBING) ×1
SUCTION TUBE FRAZIER 12FR DISP (TUBING) ×1 IMPLANT
SUT FIBERWIRE #2 38 REV NDL BL (SUTURE) ×6
SUT MNCRL AB 4-0 PS2 18 (SUTURE) IMPLANT
SUT VIC AB 0 CT1 36 (SUTURE) ×2 IMPLANT
SUT VIC AB 2-0 CT1 27 (SUTURE) ×2
SUT VIC AB 2-0 CT1 TAPERPNT 27 (SUTURE) ×2 IMPLANT
SUT VIC AB 3-0 SH 8-18 (SUTURE) ×2 IMPLANT
SUTURE FIBERWR#2 38 REV NDL BL (SUTURE) ×3 IMPLANT
SYR 10ML LL (SYRINGE) ×2 IMPLANT
SYR CONTROL 10ML LL (SYRINGE) ×2 IMPLANT
TOWEL OR 17X26 10 PK STRL BLUE (TOWEL DISPOSABLE) ×4 IMPLANT
TOWEL OR NON WOVEN STRL DISP B (DISPOSABLE) ×2 IMPLANT
TRAY FOLEY MTR SLVR 16FR STAT (SET/KITS/TRAYS/PACK) ×2 IMPLANT
WATER STERILE IRR 1000ML POUR (IV SOLUTION) ×4 IMPLANT
YANKAUER SUCT BULB TIP 10FT TU (MISCELLANEOUS) ×2 IMPLANT

## 2019-01-19 NOTE — Anesthesia Procedure Notes (Signed)
Date/Time: 01/19/2019 7:30 AM Performed by: Talbot Grumbling, CRNA Oxygen Delivery Method: Simple face mask

## 2019-01-19 NOTE — Evaluation (Signed)
Physical Therapy Evaluation Patient Details Name: Kenneth Watts MRN: [id number] DOB: 12/28/1945 Today's Date: 01/19/2019   History of Present Illness  73 yo male s/p L THA posterior approach on 01/19/19. PMH includes anxiety, AVN bilateral hips, COPD, diverticulitis, HTN, OSA, schizophrenia.  Clinical Impression  Pt presents with L hip pain, decreased L hip strength, decreased knowledge of posterior hip precautions, difficulty performing bed mobility, and decreased activity tolerance. Pt to benefit from acute PT to address deficits. Pt ambulated 28 ft with RW with min guard assist, verbal cuing for sequencing, form, and maintaining posterior hip precautions with all mobility. Pt educated on ankle pumps (20/hour) to perform this afternoon/evening to increase circulation, to pt's tolerance and limited by pain. PT to progress mobility as tolerated, and will continue to follow acutely.        Follow Up Recommendations Follow surgeon's recommendation for DC plan and follow-up therapies;Supervision for mobility/OOB(Per pt, MD and pt have set up for pt to d/c to [place])    Equipment Recommendations  None recommended by PT;Other (comment)(defer to next venue of care)    Recommendations for Other Services       Precautions / Restrictions Precautions Precautions: Fall;Posterior Hip Precaution Booklet Issued: Yes (comment) Precaution Comments: handout administered, reviewed, PT demonstrating precautions (no hip flexion >90*, no hip adduction >0*, no hip IR past midline, no crossing LEs) and reinforcing precautions during pt mobility Restrictions Weight Bearing Restrictions: No Other Position/Activity Restrictions: WBAT      Mobility  Bed Mobility Overal bed mobility: Needs Assistance Bed Mobility: Supine to Sit     Supine to sit: Min assist;HOB elevated     General bed mobility comments: Min assist for LLE lifting and translation to EOB, exited bed toward R so PT reinforcing hip  adduction precaution throughout. Verbal cuing for hip flexion precaution when scooting to EOB.  Transfers Overall transfer level: Needs assistance Equipment used: Rolling walker (2 wheeled) Transfers: Sit to/from Stand Sit to Stand: Min assist;From elevated surface         General transfer comment: Min assist for power up, steadying. Verbal cuing for upright trunk when standing and sitting, placing LLE in front of RLE when moving from sit<>stand to maintain hip precautions.  Ambulation/Gait Ambulation/Gait assistance: Min guard Gait Distance (Feet): 45 Feet Assistive device: Rolling walker (2 wheeled) Gait Pattern/deviations: Step-to pattern;Decreased stride length;Decreased stance time - left;Decreased weight shift to left;Antalgic Gait velocity: decr   General Gait Details: Min guard for safety. Verbal cuing for sequencing, placement in RW, upright posture. PT reinforcing 0* hip IR during turning toward operative side by asking pt to out-toe before initiating turn toward L.  Stairs            Wheelchair Mobility    Modified Rankin (Stroke Patients Only)       Balance Overall balance assessment: Needs assistance;History of Falls Sitting-balance support: No upper extremity supported;Feet supported Sitting balance-Leahy Scale: Good     Standing balance support: Bilateral upper extremity supported Standing balance-Leahy Scale: Poor Standing balance comment: reliant on external support in standing                             Pertinent Vitals/Pain Pain Assessment: 0-10 Pain Score: 3  Pain Location: L hip Pain Descriptors / Indicators: Sore Pain Intervention(s): Limited activity within patient's tolerance;Monitored during session;Premedicated before session;Repositioned;Ice applied    Home Living Family/patient expects to be discharged to:: Skilled nursing facility Living Arrangements:  Non-relatives/Friends                    Prior Function  Level of Independence: Independent with assistive device(s)         Comments: pt reports he used cane for ambulation PTA, but did not require assist from roommate for ADLs or mobility. Pt states his roommate cannot physically assist him, that is why he is d/c to [facility]. Pt reports fall on 01/11/19 due to tripping over cane.     Hand Dominance   Dominant Hand: Right    Extremity/Trunk Assessment   Upper Extremity Assessment Upper Extremity Assessment: Overall WFL for tasks assessed    Lower Extremity Assessment Lower Extremity Assessment: LLE deficits/detail;Generalized weakness LLE Deficits / Details: post-surgical hip weakness; able to perform ankle pumps, quad set, heel slide to 45* hip flexion LLE Sensation: WNL    Cervical / Trunk Assessment Cervical / Trunk Assessment: Normal  Communication   Communication: No difficulties  Cognition Arousal/Alertness: Awake/alert Behavior During Therapy: WFL for tasks assessed/performed;Flat affect Overall Cognitive Status: Within Functional Limits for tasks assessed                                        General Comments      Exercises     Assessment/Plan    PT Assessment Patient needs continued PT services  PT Problem List Decreased strength;Decreased mobility;Decreased safety awareness;Decreased range of motion;Decreased activity tolerance;Decreased balance;Decreased knowledge of use of DME;Pain       PT Treatment Interventions DME instruction;Therapeutic activities;Gait training;Balance training;Therapeutic exercise;Patient/family education;Functional mobility training    PT Goals (Current goals can be found in the Care Plan section)  Acute Rehab PT Goals Patient Stated Goal: walk better PT Goal Formulation: With patient Time For Goal Achievement: 01/26/19 Potential to Achieve Goals: Good    Frequency 7X/week   Barriers to discharge        Co-evaluation               AM-PAC PT "6  Clicks" Mobility  Outcome Measure Help needed turning from your back to your side while in a flat bed without using bedrails?: A Little Help needed moving from lying on your back to sitting on the side of a flat bed without using bedrails?: A Little Help needed moving to and from a bed to a chair (including a wheelchair)?: A Little Help needed standing up from a chair using your arms (e.g., wheelchair or bedside chair)?: A Little Help needed to walk in hospital room?: A Little Help needed climbing 3-5 steps with a railing? : A Lot 6 Click Score: 17    End of Session Equipment Utilized During Treatment: Gait belt Activity Tolerance: Patient tolerated treatment well;Patient limited by pain Patient left: in chair;with call bell/phone within reach;with SCD's reapplied(no alarm box in room, RN notified) Nurse Communication: Mobility status PT Visit Diagnosis: Other abnormalities of gait and mobility (R26.89);Difficulty in walking, not elsewhere classified (R26.2)    Time: [id number] PT Time Calculation (min) (ACUTE ONLY): 23 min   Charges:   PT Evaluation $PT Eval Low Complexity: 1 Low PT Treatments $Gait Training: 8-22 mins      Julien Girt, PT Acute Rehabilitation Services Pager [phone]  Office [phone]  Greggory Safranek D Elonda Husky 01/19/2019, 2:12 PM

## 2019-01-19 NOTE — Op Note (Signed)
01/19/2019  9:24 AM  PATIENT:  Kenneth Watts   MRN: [id number]  PRE-OPERATIVE DIAGNOSIS: Left hip primary localized osteoarthritis  POST-OPERATIVE DIAGNOSIS:  same  PROCEDURE:  Procedure(s): LEFT TOTAL HIP ARTHROPLASTY  PREOPERATIVE INDICATIONS:    Kenneth Watts is an 73 y.o. male who has a diagnosis of left hip primary localized osteoarthritis and elected for surgical management after failing conservative treatment.  The risks benefits and alternatives were discussed with the patient including but not limited to the risks of nonoperative treatment, versus surgical intervention including infection, bleeding, nerve injury, periprosthetic fracture, the need for revision surgery, dislocation, leg length discrepancy, blood clots, cardiopulmonary complications, morbidity, mortality, among others, and they were willing to proceed.     OPERATIVE REPORT     SURGEON:  Marchia Bond, MD    ASSISTANT:  Joya Gaskins, OPA-C  (Present throughout the entire procedure,  necessary for completion of procedure in a timely manner, assisting with retraction, instrumentation, and closure)   SECOND ASSISTANT: Mercy Moore, 3rd Year Medical Student    ANESTHESIA: Spinal  ESTIMATED BLOOD LOSS: 169 mL    COMPLICATIONS:  None.     UNIQUE ASPECTS OF THE CASE: I had excellent access.  The femoral head was extremely large.  The acetabulum was already fairly deep, some of which were osteophyte, but I did not need to medialized dramatically in order to get excellent fill uncoverage.  There was a huge inferior loose osteophyte off the acetabulum.  The femur reamed to an 8, but broached to a 7 and I did not want to get too large because his bone quality was somewhat mediocre.  I reamed the acetabulum line to line, and matched the posterior wall perfectly, with a larger amount of anterior wall exposed.    COMPONENTS:  [employer] fit femur size 7 with a 36 mm +6.7 metallic head ball and a Gription  Acetabular shell size 58, with a single cancellous screw for backup fixation, with an apex hole eliminator and a +4 neutral polyethylene liner.    PROCEDURE IN DETAIL:   The patient was met in the holding area and  identified.  The appropriate hip was identified and marked at the operative site.  The patient was then transported to the OR  and  placed under anesthesia.  At that point, the patient was  placed in the lateral decubitus position with the operative side up and  secured to the operating room table and all bony prominences padded.     The operative lower extremity was prepped from the iliac crest to the distal leg.  Sterile draping was performed.  Time out was performed prior to incision.      A routine posterolateral approach was utilized via sharp dissection  carried down to the subcutaneous tissue.  Gross bleeders were Bovie coagulated.  The iliotibial band was identified and incised along the length of the skin incision.  Self-retaining retractors were  inserted.  With the hip internally rotated, the short external rotators  were identified. The piriformis and capsule was tagged with FiberWire, and the hip capsule released in a T-type fashion.  The femoral neck was exposed, and I resected the femoral neck using the appropriate jig. This was performed at approximately a thumb's breadth above the lesser trochanter.    I then exposed the deep acetabulum, cleared out any tissue including the ligamentum teres.  A wing retractor was placed but didn't have good hold and I resorted to just using the  hibbs.  After adequate visualization, I excised the labrum, and then sequentially reamed.  I placed the trial acetabulum, which seated nicely, and then impacted the real cup into place.  Appropriate version and inclination was confirmed clinically matching their bony anatomy, and also with the use of the jig.  I placed a cancellous screw to augment fixation.  A trial polyethylene liner was placed.     I then prepared the proximal femur using the cookie-cutter, the lateralizing reamer, and then sequentially reamed and broached.  A trial broach, neck, and head was utilized, and I reduced the hip and it was found to have excellent stability with functional range of motion. The trial components were then removed, and the real polyethylene liner was placed.  I then impacted the real femoral prosthesis into place into the appropriate version, matching the normal anatomy, and I impacted the real head ball into place. The hip was then reduced and taken through functional range of motion and found to have excellent stability. Leg lengths were restored.  I then used a 2 mm drill bits to pass the FiberWire suture from the capsule and piriformis through the greater trochanter, and secured this. Excellent posterior capsular repair was achieved. I also closed the T in the capsule.  Soft tissue tension was appropriate.  I then irrigated the hip copiously again with pulse lavage, and repaired the fascia with Vicryl, followed by Vicryl for the subcutaneous tissue, Monocryl for the skin, Steri-Strips and sterile gauze. The wounds were injected. The patient was then awakened and returned to PACU in stable and satisfactory condition. There were no complications.  Marchia Bond, MD Orthopedic Surgeon [phone]   01/19/2019 9:24 AM

## 2019-01-19 NOTE — Transfer of Care (Signed)
Immediate Anesthesia Transfer of Care Note  Patient: TAELIN SCATENA  Procedure(s) Performed: TOTAL HIP ARTHROPLASTY (Left Hip)  Patient Location: PACU  Anesthesia Type:Spinal  Level of Consciousness: sedated  Airway & Oxygen Therapy: Patient Spontanous Breathing and Patient connected to face mask oxygen  Post-op Assessment: Report given to RN and Post -op Vital signs reviewed and stable  Post vital signs: Reviewed and stable  Last Vitals:  Vitals Value Taken Time  BP 137/70 01/19/19 0956  Temp    Pulse 59 01/19/19 0957  Resp 13 01/19/19 0957  SpO2 100 % 01/19/19 0957  Vitals shown include unvalidated device data.  Last Pain:  Vitals:   01/19/19 0548  TempSrc: Oral         Complications: No apparent anesthesia complications

## 2019-01-19 NOTE — Anesthesia Postprocedure Evaluation (Signed)
Anesthesia Post Note  Patient: Kenneth Watts  Procedure(s) Performed: TOTAL HIP ARTHROPLASTY (Left Hip)     Patient location during evaluation: PACU Anesthesia Type: Spinal Level of consciousness: oriented and awake and alert Pain management: pain level controlled Vital Signs Assessment: post-procedure vital signs reviewed and stable Respiratory status: spontaneous breathing, respiratory function stable and patient connected to nasal cannula oxygen Cardiovascular status: blood pressure returned to baseline and stable Postop Assessment: no headache, no backache and no apparent nausea or vomiting Anesthetic complications: no    Last Vitals:  Vitals:   01/19/19 1248 01/19/19 1358  BP: 138/65 119/74  Pulse: 64 74  Resp: 14 16  Temp: 36.4 C 36.7 C  SpO2: 99% 98%    Last Pain:  Vitals:   01/19/19 1358  TempSrc: Axillary  PainSc:                  Chelsey L Woodrum

## 2019-01-19 NOTE — Discharge Instructions (Signed)

## 2019-01-19 NOTE — H&P (Signed)
PREOPERATIVE H&P  Chief Complaint: left hip pain  HPI: Kenneth Watts is a 73 y.o. male who presents for preoperative history and physical with a diagnosis of left hip oa. Symptoms are rated as moderate to severe, and have been worsening.  This is significantly impairing activities of daily living.  He has elected for surgical management.   He has failed injections, activity modification, anti-inflammatories, and assistive devices.  Preoperative X-rays demonstrate end stage degenerative changes with osteophyte formation, loss of joint space, subchondral sclerosis.   Past Medical History:  Diagnosis Date  . Anxiety   . Avascular necrosis of bones of both hips ([place])   . COPD (chronic obstructive pulmonary disease) ([facility])    no inhalers used  . Diverticulitis    no recent problems  . Hypertension   . Melanoma ([place]) 2014   left arm  . OSA (obstructive sleep apnea)   . Oxygen dependent    3 liters with cpap set on 5 at hs  . Schizophrenia ([place])    "paranoid schizopherneic with serois anger issues, 3 personalities, multiple psych issues"   Past Surgical History:  Procedure Laterality Date  . CHOLECYSTECTOMY    . HERNIA REPAIR     chest  . MELANOMA EXCISION Left 10/01/12   arm   Social History   Socioeconomic History  . Marital status: Single    Spouse name: Not on file  . Number of children: Not on file  . Years of education: Not on file  . Highest education level: Not on file  Occupational History  . Not on file  Social Needs  . Financial resource strain: Not on file  . Food insecurity    Worry: Not on file    Inability: Not on file  . Transportation needs    Medical: Not on file    Non-medical: Not on file  Tobacco Use  . Smoking status: Former Smoker    Packs/day: 1.00    Years: 19.00    Pack years: 19.00    Quit date: 05/20/1984    Years since quitting: 34.6  . Smokeless tobacco: Never Used  Substance and Sexual Activity  . Alcohol use: No  . Drug use:  Never  . Sexual activity: Not on file  Lifestyle  . Physical activity    Days per week: Not on file    Minutes per session: Not on file  . Stress: Not on file  Relationships  . Social Herbalist on phone: Not on file    Gets together: Not on file    Attends religious service: Not on file    Active member of club or organization: Not on file    Attends meetings of clubs or organizations: Not on file    Relationship status: Not on file  Other Topics Concern  . Not on file  Social History Narrative   Works midnight shift as a Presenter, broadcasting   1 son in [place]   1 son in [place]- lives with a significant other   Enjoys working on Teaching laboratory technician, goes to [employer]   Girlfriend has a Architectural technologist in counselor/education    Has worked in the past as a Community education officer in the past.              Family History  Problem Relation Age of Onset  . Alcohol abuse Mother   . Hypertension Father   . Cancer Paternal Uncle   .  Diabetes Neg Hx   . Heart disease Neg Hx    Allergies  Allergen Reactions  . Lisinopril Other (See Comments)    Cough   Prior to Admission medications   Medication Sig Start Date End Date Taking? Authorizing Provider  aspirin 81 MG chewable tablet Chew by mouth every evening.   Yes [provider]  clomiPHENE (CLOMID) 50 MG tablet Take 25 mg by mouth every evening.  08/03/18  Yes [provider]  levocetirizine (XYZAL) 5 MG tablet Take 5 mg by mouth every evening.   Yes [provider]  losartan-hydrochlorothiazide (HYZAAR) 50-12.5 MG tablet Take 1 tablet by mouth daily. 11/09/15  Yes Debbrah Alar, NP  LUTEIN PO Take 1 capsule by mouth daily.   Yes [provider]  Multiple Vitamin (MULTIVITAMIN) tablet Take 1 tablet by mouth daily.    Yes [provider]  QUEtiapine (SEROQUEL) 50 MG tablet Take 50 mg by mouth at bedtime.    Yes [provider]  Resveratrol 100 MG CAPS  Take 100 mg by mouth daily.   Yes [provider]  ziprasidone (GEODON) 60 MG capsule Take 60 mg by mouth every evening.    Yes [provider]  ziprasidone (GEODON) 80 MG capsule Take 80 mg by mouth every evening.    Yes [provider]     Positive ROS: All other systems have been reviewed and were otherwise negative with the exception of those mentioned in the HPI and as above.  Physical Exam: General: Alert, no acute distress Cardiovascular: No pedal edema Respiratory: No cyanosis, no use of accessory musculature GI: No organomegaly, abdomen is soft and non-tender Skin: No lesions in the area of chief complaint Neurologic: Sensation intact distally Psychiatric: Patient is competent for consent with normal mood and affect Lymphatic: No axillary or cervical lymphadenopathy  MUSCULOSKELETAL: left hip has almost no IR, flexion to 80, painful arc, ehl intact  Assessment: Left hip osteoarthritis   Plan: Plan for Procedure(s): TOTAL HIP ARTHROPLASTY  The risks benefits and alternatives were discussed with the patient including but not limited to the risks of nonoperative treatment, versus surgical intervention including infection, bleeding, nerve injury,  blood clots, cardiopulmonary complications, morbidity, mortality, among others, and they were willing to proceed.   Anticipated LOS equal to or greater than 2 midnights due to - Age 51 and older with one or more of the following:  - Obesity  - Expected need for hospital services (PT, OT, Nursing) required for safe  discharge  - Anticipated need for postoperative skilled nursing care or inpatient rehab  - Active co-morbidities: None   Johnny Bridge, MD Cell [phone]   01/19/2019 7:23 AM'

## 2019-01-19 NOTE — Progress Notes (Addendum)
Pt's friend, Barbaraann Share, called this nurse and expressed extreme concern for the condition the patient will be in after surgery. She states, "he has three different personalities and we are so concerned that when he wakes up from anesthesia he will have an imbalance of his psychotic medication." She verbalized being more concerned about days 1-3 after surgery and requested that patient stay longer for psychotic evaluation. I explained to Barbaraann Share that talking with Dr. Thea Alken about this would be the most beneficial plan moving forward. Pt was calm and cooperative will getting him ready for surgery today.

## 2019-01-19 NOTE — Anesthesia Procedure Notes (Signed)
Spinal  Patient location during procedure: OR Start time: 01/19/2019 7:30 AM End time: 01/19/2019 7:40 AM Staffing Anesthesiologist: Freddrick March, MD Performed: anesthesiologist  Preanesthetic Checklist Completed: patient identified, surgical consent, pre-op evaluation, timeout performed, IV checked, risks and benefits discussed and monitors and equipment checked Spinal Block Patient position: sitting Prep: site prepped and draped and DuraPrep Patient monitoring: cardiac monitor, continuous pulse ox and blood pressure Approach: midline Location: L3-4 Injection technique: single-shot Needle Needle type: Quincke  Needle gauge: 22 G Needle length: 9 cm Assessment Sensory level: T6 Additional Notes Functioning IV was confirmed and monitors were applied. Sterile prep and drape, including hand hygiene and sterile gloves were used. The patient was positioned and the spine was prepped. The skin was anesthetized with lidocaine.  Free flow of clear CSF was obtained prior to injecting local anesthetic into the CSF.  The spinal needle aspirated freely following injection.  The needle was carefully withdrawn.  The patient tolerated the procedure well.

## 2019-01-20 ENCOUNTER — Encounter (HOSPITAL_COMMUNITY): Payer: Self-pay | Admitting: Orthopedic Surgery

## 2019-01-20 LAB — CBC
HCT: 37 % — ABNORMAL LOW (ref 39.0–52.0)
Hemoglobin: 11.9 g/dL — ABNORMAL LOW (ref 13.0–17.0)
MCH: 30.5 pg (ref 26.0–34.0)
MCHC: 32.2 g/dL (ref 30.0–36.0)
MCV: 94.9 fL (ref 80.0–100.0)
Platelets: 141 10*3/uL — ABNORMAL LOW (ref 150–400)
RBC: 3.9 MIL/uL — ABNORMAL LOW (ref 4.22–5.81)
RDW: 12.3 % (ref 11.5–15.5)
WBC: 12.5 10*3/uL — ABNORMAL HIGH (ref 4.0–10.5)
nRBC: 0 % (ref 0.0–0.2)

## 2019-01-20 LAB — BASIC METABOLIC PANEL
Anion gap: 7 (ref 5–15)
BUN: 18 mg/dL (ref 8–23)
CO2: 24 mmol/L (ref 22–32)
Calcium: 8.6 mg/dL — ABNORMAL LOW (ref 8.9–10.3)
Chloride: 107 mmol/L (ref 98–111)
Creatinine, Ser: 0.97 mg/dL (ref 0.61–1.24)
GFR calc Af Amer: >60 mL/min (ref >60)
GFR calc non Af Amer: >60 mL/min (ref >60)
Glucose, Bld: 123 mg/dL — ABNORMAL HIGH (ref 70–99)
Potassium: 4 mmol/L (ref 3.5–5.1)
Sodium: 138 mmol/L (ref 135–145)

## 2019-01-20 NOTE — Progress Notes (Signed)
Physical Therapy Treatment Patient Details Name: Kenneth Watts MRN: [id number] DOB: 12-13-1945 Today's Date: 01/20/2019    History of Present Illness 73 yo male s/p L THA posterior approach on 01/19/19. PMH includes anxiety, AVN bilateral hips, COPD, diverticulitis, HTN, OSA, schizophrenia.    PT Comments    Pt progressing slowly, very cooperative with PT. Requires multi-modal cues and frequent reinforcement to maintain posterior  THP  Follow Up Recommendations  Follow surgeon's recommendation for DC plan and follow-up therapies;Supervision for mobility/OOB(needs SNF)     Equipment Recommendations  None recommended by PT(defer to SNF)    Recommendations for Other Services       Precautions / Restrictions Precautions Precautions: Fall;Posterior Hip Precaution Comments: handout administered, reviewed, PT demonstrating precautions (no hip flexion >90*, no hip adduction >0*, no hip IR past midline, no crossing LEs) and reinforcing precautions during pt mobility Restrictions Weight Bearing Restrictions: No Other Position/Activity Restrictions: WBAT    Mobility  Bed Mobility   Bed Mobility: Supine to Sit     Supine to sit: Min assist;HOB elevated     General bed mobility comments: Min assist for LLE lifting and translation to EOB, exited bed toward R so PT reinforcing hip adduction precaution throughout. Verbal cuing for hip flexion precaution when scooting to EOB.  Transfers Overall transfer level: Needs assistance Equipment used: Rolling walker (2 wheeled) Transfers: Sit to/from Stand Sit to Stand: Min assist;From elevated surface         General transfer comment: Min assist for power up, steadying. Verbal cuing for upright trunk when standing and sitting, placing LLE in front of RLE when moving from sit<>stand to maintain hip precautions.  Ambulation/Gait Ambulation/Gait assistance: Min guard Gait Distance (Feet): 60 Feet Assistive device: Rolling walker (2  wheeled) Gait Pattern/deviations: Step-to pattern;Decreased stride length;Decreased stance time - left;Decreased weight shift to left;Antalgic Gait velocity: decr   General Gait Details: Min guard for safety. Verbal cuing for sequencing, placement in RW, upright posture. PT reinforcing 0* hip IR during turning toward operative side by asking pt to out-toe before initiating turn toward L.   Stairs             Wheelchair Mobility    Modified Rankin (Stroke Patients Only)       Balance                                            Cognition Arousal/Alertness: Awake/alert Behavior During Therapy: WFL for tasks assessed/performed;Flat affect Overall Cognitive Status: Within Functional Limits for tasks assessed                                        Exercises      General Comments        Pertinent Vitals/Pain Pain Assessment: 0-10 Pain Score: 3  Pain Location: L hip Pain Descriptors / Indicators: Sore Pain Intervention(s): Limited activity within patient's tolerance;Monitored during session;Repositioned    Home Living                      Prior Function            PT Goals (current goals can now be found in the care plan section) Acute Rehab PT Goals Patient Stated Goal: walk better PT Goal Formulation: With patient  Time For Goal Achievement: 01/26/19 Potential to Achieve Goals: Good Progress towards PT goals: Progressing toward goals    Frequency    7X/week      PT Plan Current plan remains appropriate    Co-evaluation              AM-PAC PT "6 Clicks" Mobility   Outcome Measure  Help needed turning from your back to your side while in a flat bed without using bedrails?: A Little Help needed moving from lying on your back to sitting on the side of a flat bed without using bedrails?: A Little Help needed moving to and from a bed to a chair (including a wheelchair)?: A Little Help needed standing up  from a chair using your arms (e.g., wheelchair or bedside chair)?: A Little Help needed to walk in hospital room?: A Little Help needed climbing 3-5 steps with a railing? : A Lot 6 Click Score: 17    End of Session Equipment Utilized During Treatment: Gait belt Activity Tolerance: Patient tolerated treatment well;Patient limited by fatigue Patient left: in chair;with call bell/phone within reach(no alarm box in room)   PT Visit Diagnosis: Other abnormalities of gait and mobility (R26.89);Difficulty in walking, not elsewhere classified (R26.2)     Time: [id number] PT Time Calculation (min) (ACUTE ONLY): 15 min  Charges:  $Gait Training: 8-22 mins                     Kenneth Watts, PT  Pager: [phone] Acute Rehab Dept [facility]): [id number]   01/20/2019    [facility] 01/20/2019, 1:42 PM

## 2019-01-20 NOTE — Progress Notes (Signed)
     Subjective:  Patient reports pain as mild.  Feels well, but anxious about lack of home support and need for care.  Needing assistance with ambulation.  Objective:   VITALS:   Vitals:   01/19/19 2152 01/19/19 2208 01/20/19 0118 01/20/19 0548  BP:  102/60 120/67 128/74  Pulse: 71 72 61 68  Resp: 16 18 18 15   Temp:  98 F (36.7 C) 97.7 F (36.5 C) 97.7 F (36.5 C)  TempSrc:  Oral Oral Oral  SpO2: 99% 99% 100% 97%  Weight:      Height:        Neurologically intact Dorsiflexion/Plantar flexion intact Incision: no drainage   Lab Results  Component Value Date   WBC 12.5 (H) 01/20/2019   HGB 11.9 (L) 01/20/2019   HCT 37.0 (L) 01/20/2019   MCV 94.9 01/20/2019   PLT 141 (L) 01/20/2019   BMET    Component Value Date/Time   NA 138 01/20/2019 0518   K 4.0 01/20/2019 0518   CL 107 01/20/2019 0518   CO2 24 01/20/2019 0518   GLUCOSE 123 (H) 01/20/2019 0518   BUN 18 01/20/2019 0518   CREATININE 0.97 01/20/2019 0518   CREATININE 1.27 08/06/2013 1431   CALCIUM 8.6 (L) 01/20/2019 0518   GFRNONAA >60 01/20/2019 0518   GFRAA >60 01/20/2019 0518     Assessment/Plan: 1 Day Post-Op   Principal Problem:   Primary localized osteoarthritis of left hip   Advance diet Up with therapy  Patient has schizophrenia, and no social support.  He desperately wants skilled placement, and his friend has counseled me on his destructive coping mechanisms.  No current safe discharge plan, hoping to work for skilled placement.    Anticipated LOS equal to or greater than 2 midnights due to - Age 74 and older with one or more of the following:  - Obesity  - Expected need for hospital services (PT, OT, Nursing) required for safe  discharge  - Anticipated need for postoperative skilled nursing care or inpatient rehab  - Active co-morbidities: schizophrenia OR   - Unanticipated findings during/Post Surgery: Slow post-op progression: GI, pain control, mobility  - Patient is a high risk  of re-admission due to: Barriers to post-acute care (logistical, no family support in home)     Kenneth Watts 01/20/2019, 7:35 AM   Marchia Bond, MD Cell [phone]

## 2019-01-20 NOTE — TOC Initial Note (Signed)
Transition of Care [facility]) - Initial/Assessment Note    Patient Details  Name: Kenneth Watts MRN: [id number] Date of Birth: 1946/01/21  [facility]) CM/SW Contact:    Lia Hopping, [place] Phone Number: 01/20/2019, 10:47 AM  Clinical Narrative:     Patient admitted for surgical intervention of left hip. Patient live with friend Barbaraann Share and will need assistance following surgery.  CSW  discussed disposition plan for SNF rehab at [place]. Patient aware of plan and need for insurance approval. CSW explain [place] PASRR (Pre-Admission Screening and Resident Review). Patient reports understanding.  Insurance authorization initiated on 9/1.  PASRR level II, need physician to sign "30 day note" (On the patient chart).    Expected Discharge Plan: Skilled Nursing Facility Barriers to Discharge: Continued Medical Work up, [place] (PASRR), Insurance Authorization   Patient Goals and CMS Choice Patient states their goals for this hospitalization and ongoing recovery are:: to go home CMS Medicare.gov Compare Post Acute Care list provided to:: (Arranged in Orthopedic Office) Choice offered to / list presented to : Patient  Expected Discharge Plan and Services Expected Discharge Plan: [place] In-house Referral: Clinical Social Work Discharge Planning Services: CM Consult   Living arrangements for the past 2 months: [place]                                      Prior Living Arrangements/Services Living arrangements for the past 2 months: Single Family Home Lives with:: Friends Patient language and need for interpreter reviewed:: No Do you feel safe going back to the place where you live?: Yes      Need for Family Participation in Patient Care: Yes (Comment) Care giver support system in place?: Yes (comment) Current home services: DME Criminal Activity/Legal Involvement Pertinent to Current Situation/Hospitalization: No -  Comment as needed  Activities of Daily Living Home Assistive Devices/Equipment: Eyeglasses, Cane (specify quad or straight) ADL Screening (condition at time of admission) Patient's cognitive ability adequate to safely complete daily activities?: Yes Is the patient deaf or have difficulty hearing?: No Does the patient have difficulty seeing, even when wearing glasses/contacts?: No Does the patient have difficulty concentrating, remembering, or making decisions?: No Patient able to express need for assistance with ADLs?: No Does the patient have difficulty dressing or bathing?: No Independently performs ADLs?: Yes (appropriate for developmental age) Does the patient have difficulty walking or climbing stairs?: No Weakness of Legs: None Weakness of Arms/Hands: None  Permission Sought/Granted Permission sought to share information with : Case Manager, Customer service manager Permission granted to share information with : Yes, Verbal Permission Granted     Permission granted to share info w AGENCY: SNF- [facility]  Permission granted to share info w Relationship: Friend  Permission granted to share info w Contact Information: Cherylynn Ridges [phone]  Emotional Assessment Appearance:: Appears stated age Attitude/Demeanor/Rapport: Engaged Affect (typically observed): Accepting, Calm Orientation: : Oriented to Self, Oriented to Place, Oriented to  Time, Oriented to Situation Alcohol / Substance Use: Not Applicable Psych Involvement: No (comment)  Admission diagnosis:  OA LEFT HIP Patient Active Problem List   Diagnosis Date Noted  . Primary localized osteoarthritis of left hip 01/19/2019  . Hyperglycemia 05/10/2015  . Dyspepsia 01/05/2015  . Skin cancer 01/05/2015  . Vitamin D deficiency 12/02/2014  . Infectious mononucleosis 12/02/2014  . Hyperlipidemia 12/01/2014  . Insomnia 10/13/2013  . Pain  due to dental caries 09/29/2013  . Nonspecific abnormal  electrocardiogram (ECG) (EKG) 07/16/2013  . 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 Schizophrenia ([place]) 01/15/2013  . OSA (obstructive sleep apnea) 01/15/2013  . Gout 01/15/2013  . Melanoma ([place]) 01/15/2013  . HTN (hypertension) 01/15/2013   PCP:  Azell Der, MD Pharmacy:   [facility] [address] - [place], [place] - [address], [address] [place] [address] Phone: [phone] Fax: [place], [address] [place] [address] Phone: [phone] Fax: [phone]     Social Determinants of Health (SDOH) Interventions    Readmission Risk Interventions No flowsheet data found.

## 2019-01-20 NOTE — NC FL2 (Signed)
[place] LEVEL OF CARE SCREENING TOOL     IDENTIFICATION  Patient Name: Kenneth Watts Birthdate: 1946-02-15 Sex: male Admission Date (Current Location): 01/19/2019  [facility] and [place] Number:  Herbalist and Address:  [facility],  [place] [address], [place]      Provider Number: [id number]  Attending Physician Name and Address:  Marchia Bond, MD  Relative Name and Phone Number:       Current Level of Care: Hospital Recommended Level of Care: [place] Prior Approval Number:    Date Approved/Denied:   PASRR Number: Pending  Discharge Plan: SNF    Current Diagnoses: Patient Active Problem List   Diagnosis Date Noted  . Primary localized osteoarthritis of left hip 01/19/2019  . Hyperglycemia 05/10/2015  . Dyspepsia 01/05/2015  . Skin cancer 01/05/2015  . Vitamin D deficiency 12/02/2014  . Infectious mononucleosis 12/02/2014  . Hyperlipidemia 12/01/2014  . Insomnia 10/13/2013  . Pain due to dental caries 09/29/2013  . Nonspecific abnormal electrocardiogram (ECG) (EKG) 07/16/2013  . 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 Schizophrenia ([place]) 01/15/2013  . OSA (obstructive sleep apnea) 01/15/2013  . Gout 01/15/2013  . Melanoma ([place]) 01/15/2013  . HTN (hypertension) 01/15/2013    Orientation RESPIRATION BLADDER Height & Weight     Self, Time, Situation, Place  Normal Continent Weight: 261 lb 15.9 oz (118.8 kg) Height:  6' (182.9 cm)  BEHAVIORAL SYMPTOMS/MOOD NEUROLOGICAL BOWEL NUTRITION STATUS      Continent (Regular Diet)  AMBULATORY STATUS COMMUNICATION OF NEEDS Skin   Extensive Assist Verbally (Left Hip)                       Personal Care Assistance Level of Assistance  Bathing, Dressing, Feeding Bathing Assistance: Limited assistance Feeding assistance: Independent Dressing Assistance: Limited assistance     Functional Limitations Info  Sight, Speech, Hearing Sight Info: Adequate Hearing  Info: Adequate Speech Info: Adequate    SPECIAL CARE FACTORS FREQUENCY  PT (By licensed PT), OT (By licensed OT)     PT Frequency: 5x/week OT Frequency: 5x/week            Contractures Contractures Info: Not present    Additional Factors Info  Psychotropic Code Status Info: Fullcode Allergies Info: Allergies: Lisinopril Psychotropic Info: Seroquel, Geodon 50mg , 60mg          Current Medications (01/20/2019):  This is the current hospital active medication list Current Facility-Administered Medications  Medication Dose Route Frequency Provider Last Rate Last Dose  . 0.45 % NaCl with KCl 20 mEq / L infusion   Intravenous Continuous Marchia Bond, MD 75 mL/hr at 01/20/19 0400    . acetaminophen (TYLENOL) tablet 325-650 mg  325-650 mg Oral Q6H PRN Marchia Bond, MD      . acetaminophen (TYLENOL) tablet 500 mg  500 mg Oral Q6H Marchia Bond, MD   500 mg at 01/20/19 0523  . alum & mag hydroxide-simeth (MAALOX/MYLANTA) 200-200-20 MG/5ML suspension 30 mL  30 mL Oral Q4H PRN Marchia Bond, MD      . aspirin EC tablet 325 mg  325 mg Oral BID Marchia Bond, MD   325 mg at 01/20/19 0936  . bisacodyl (DULCOLAX) suppository 10 mg  10 mg Rectal Daily PRN Marchia Bond, MD      . clomiPHENE (CLOMID) tablet 25 mg  25 mg Oral QPM Marchia Bond, MD      . diphenhydrAMINE (BENADRYL) 12.5 MG/5ML elixir 12.5-25 mg  12.5-25  mg Oral Q4H PRN Marchia Bond, MD      . docusate sodium (COLACE) capsule 100 mg  100 mg Oral BID Marchia Bond, MD   100 mg at 01/19/19 2113  . losartan (COZAAR) tablet 50 mg  50 mg Oral Daily Marchia Bond, MD   50 mg at 01/20/19 [id number]   And  . hydrochlorothiazide (MICROZIDE) capsule 12.5 mg  12.5 mg Oral Daily Marchia Bond, MD   12.5 mg at 01/20/19 [id number]  . HYDROcodone-acetaminophen (NORCO) 7.5-325 MG per tablet 1-2 tablet  1-2 tablet Oral Q4H PRN Marchia Bond, MD      . HYDROcodone-acetaminophen (NORCO/VICODIN) 5-325 MG per tablet 1-2 tablet  1-2 tablet Oral Q4H  PRN Marchia Bond, MD      . loratadine (CLARITIN) tablet 10 mg  10 mg Oral QPM Marchia Bond, MD   10 mg at 01/19/19 1728  . magnesium citrate solution 1 Bottle  1 Bottle Oral Once PRN Marchia Bond, MD      . menthol-cetylpyridinium (CEPACOL) lozenge 3 mg  1 lozenge Oral PRN Marchia Bond, MD       Or  . phenol (CHLORASEPTIC) mouth spray 1 spray  1 spray Mouth/Throat PRN Marchia Bond, MD      . methocarbamol (ROBAXIN) tablet 500 mg  500 mg Oral Q6H PRN Marchia Bond, MD   500 mg at 01/19/19 2113   Or  . methocarbamol (ROBAXIN) 500 mg in dextrose 5 % 50 mL IVPB  500 mg Intravenous Q6H PRN Marchia Bond, MD      . metoCLOPramide (REGLAN) tablet 5-10 mg  5-10 mg Oral Q8H PRN Marchia Bond, MD       Or  . metoCLOPramide (REGLAN) injection 5-10 mg  5-10 mg Intravenous Q8H PRN Marchia Bond, MD      . morphine 4 MG/ML injection 0.52-1 mg  0.52-1 mg Intravenous Q2H PRN Marchia Bond, MD      . multivitamin with minerals tablet 1 tablet  1 tablet Oral Daily Marchia Bond, MD   1 tablet at 01/20/19 [phone]  . ondansetron (ZOFRAN) tablet 4 mg  4 mg Oral Q6H PRN Marchia Bond, MD       Or  . ondansetron [facility]) injection 4 mg  4 mg Intravenous Q6H PRN Marchia Bond, MD      . polyethylene glycol (MIRALAX / GLYCOLAX) packet 17 g  17 g Oral Daily PRN Marchia Bond, MD      . QUEtiapine (SEROQUEL) tablet 50 mg  50 mg Oral QHS Marchia Bond, MD   50 mg at 01/19/19 2113  . ziprasidone (GEODON) capsule 60 mg  60 mg Oral QPM Marchia Bond, MD   60 mg at 01/19/19 1732  . ziprasidone (GEODON) capsule 80 mg  80 mg Oral QPM Marchia Bond, MD   80 mg at 01/19/19 1731  . zolpidem (AMBIEN) tablet 5 mg  5 mg Oral QHS PRN Marchia Bond, MD         Discharge Medications: Please see discharge summary for a list of discharge medications.  Relevant Imaging Results:  Relevant Lab Results:   Additional Information [id number]  Lia Hopping, LCSW

## 2019-01-20 NOTE — Progress Notes (Signed)
01/20/19 1500  PT Visit Information  Last PT Received On 01/20/19  Pt progressing with PT. Continue to recomnend SNF. Continue PT POC  Assistance Needed +1  History of Present Illness 73 yo male s/p L THA posterior approach on 01/19/19. PMH includes anxiety, AVN bilateral hips, COPD, diverticulitis, HTN, OSA, schizophrenia.  Subjective Data  Patient Stated Goal walk better  Precautions  Precautions Fall;Posterior Hip  Precaution Comments handout administered, reviewed, PT demonstrating precautions (no hip flexion >90*, no hip adduction >0*, no hip IR past midline, no crossing LEs) and reinforcing precautions during pt mobility  Restrictions  Weight Bearing Restrictions No  Other Position/Activity Restrictions WBAT  Pain Assessment  Pain Assessment 0-10  Pain Score 2  Pain Location L hip  Pain Descriptors / Indicators Sore  Pain Intervention(s) Limited activity within patient's tolerance;Monitored during session  Cognition  Arousal/Alertness Awake/alert  Behavior During Therapy WFL for tasks assessed/performed;Flat affect  Overall Cognitive Status Within Functional Limits for tasks assessed  Bed Mobility  General bed mobility comments in recliner on PT arrival  Transfers  Overall transfer level Needs assistance  Equipment used Rolling walker (2 wheeled)  Transfers Sit to/from Stand  Sit to Stand Min assist;Min guard  General transfer comment light assist to rise and transition to RW. cues for hand placement and THP  Ambulation/Gait  Ambulation/Gait assistance Min guard  Gait Distance (Feet) 45 Feet  Assistive device Rolling walker (2 wheeled)  Gait Pattern/deviations Step-to pattern;Decreased stride length;Decreased stance time - left;Decreased weight shift to left;Antalgic  General Gait Details Min guard for safety. Verbal cuing for sequencing, placement in RW, L LE 0* IR, cues to turn toward R side to avoid L hip IR  Gait velocity decr  Total Joint Exercises  Ankle  Circles/Pumps AROM;Both;10 reps  Quad Sets AROM;Both;10 reps  Heel Slides AAROM;Left;10 reps  Hip ABduction/ADduction AAROM;Left;10 reps  Long [employer] AROM;Strengthening;Left;10 reps;Seated  PT - End of Session  Equipment Utilized During Treatment Gait belt  Activity Tolerance Patient tolerated treatment well;Patient limited by fatigue  Patient left in chair;with call bell/phone within reach (no alarm box in room)   PT - Assessment/Plan  PT Plan Current plan remains appropriate  PT Visit Diagnosis Other abnormalities of gait and mobility (R26.89);Difficulty in walking, not elsewhere classified (R26.2)  PT Frequency (ACUTE ONLY) 7X/week  Follow Up Recommendations Follow surgeon's recommendation for DC plan and follow-up therapies;Supervision for mobility/OOB (needs SNF)  PT equipment None recommended by PT (defer to SNF)  AM-PAC PT "6 Clicks" Mobility Outcome Measure (Version 2)  Help needed turning from your back to your side while in a flat bed without using bedrails? 3  Help needed moving from lying on your back to sitting on the side of a flat bed without using bedrails? 3  Help needed moving to and from a bed to a chair (including a wheelchair)? 3  Help needed standing up from a chair using your arms (e.g., wheelchair or bedside chair)? 3  Help needed to walk in hospital room? 3  Help needed climbing 3-5 steps with a railing?  2  6 Click Score 17  Consider Recommendation of Discharge To: Home with [facility]  PT Goal Progression  Progress towards PT goals Progressing toward goals  Acute Rehab PT Goals  PT Goal Formulation With patient  Time For Goal Achievement 01/26/19  Potential to Achieve Goals Good  PT Time Calculation  PT Start Time (ACUTE ONLY) 1539  PT Stop Time (ACUTE ONLY) 1554  PT Time  Calculation (min) (ACUTE ONLY) 15 min  PT General Charges  $$ ACUTE PT VISIT 1 Visit  PT Treatments  $Therapeutic Exercise 8-22 mins

## 2019-01-20 NOTE — Plan of Care (Signed)
  Problem: Education: Goal: Knowledge of the prescribed therapeutic regimen will improve Outcome: Progressing   Problem: Education: Goal: Understanding of discharge needs will improve Outcome: Progressing   Problem: Pain Management: Goal: Pain level will decrease with appropriate interventions Outcome: Progressing   

## 2019-01-21 LAB — BASIC METABOLIC PANEL
Anion gap: 6 (ref 5–15)
BUN: 16 mg/dL (ref 8–23)
CO2: 26 mmol/L (ref 22–32)
Calcium: 8.6 mg/dL — ABNORMAL LOW (ref 8.9–10.3)
Chloride: 109 mmol/L (ref 98–111)
Creatinine, Ser: 0.89 mg/dL (ref 0.61–1.24)
GFR calc Af Amer: >60 mL/min (ref >60)
GFR calc non Af Amer: >60 mL/min (ref >60)
Glucose, Bld: 123 mg/dL — ABNORMAL HIGH (ref 70–99)
Potassium: 4.3 mmol/L (ref 3.5–5.1)
Sodium: 141 mmol/L (ref 135–145)

## 2019-01-21 LAB — CBC
HCT: 37.4 % — ABNORMAL LOW (ref 39.0–52.0)
Hemoglobin: 11.8 g/dL — ABNORMAL LOW (ref 13.0–17.0)
MCH: 29.9 pg (ref 26.0–34.0)
MCHC: 31.6 g/dL (ref 30.0–36.0)
MCV: 94.7 fL (ref 80.0–100.0)
Platelets: 163 10*3/uL (ref 150–400)
RBC: 3.95 MIL/uL — ABNORMAL LOW (ref 4.22–5.81)
RDW: 12.5 % (ref 11.5–15.5)
WBC: 11.3 10*3/uL — ABNORMAL HIGH (ref 4.0–10.5)
nRBC: 0 % (ref 0.0–0.2)

## 2019-01-21 NOTE — TOC Progression Note (Signed)
Transition of Care [facility]) - Progression Note    Patient Details  Name: COURTLIN COCKER MRN: [id number] Date of Birth: 1945-08-19  [facility] CM/SW Contact  Leeroy Cha, RN Phone Number: 01/21/2019, 11:11 AM  Clinical Narrative:    30 day note faxed to [place] must at [phone] for level 2 review.   Expected Discharge Plan: Skilled Nursing Facility Barriers to Discharge: Continued Medical Work up, Programmer, applications (PASRR), Insurance Authorization  Expected Discharge Plan and Services Expected Discharge Plan: [place] In-house Referral: Clinical Social Work Discharge Planning Services: CM Consult   Living arrangements for the past 2 months: Single Family Home                                       Social Determinants of Health (SDOH) Interventions    Readmission Risk Interventions No flowsheet data found.

## 2019-01-21 NOTE — Progress Notes (Signed)
I was notified by Kathrin Greathouse regarding the pt's insurance denying SNF. Insurance requesting a peer-to-peer with Marchia Bond, MD.  Mardelle Matte paged/notified.

## 2019-01-21 NOTE — TOC Progression Note (Signed)
Transition of Care [facility]) - Progression Note    Patient Details  Name: Kenneth Watts MRN: [id number] Date of Birth: Mar 16, 1946  Transition of Care [facility]) CM/SW [place], [place] Phone Number: 01/21/2019, 1:34 PM  Clinical Narrative:    Kenneth Watts has requested to do a Peer to Peer/ CSW notified nurse to inform the physician.  Physician Please call: [phone] Option 3 Reference # [id number]   Expected Discharge Plan: Major Barriers to Discharge: Continued Medical Work up, Awaiting State Approval (PASRR), Insurance Authorization  Expected Discharge Plan and Services Expected Discharge Plan: [place] In-house Referral: Clinical Social Work Discharge Planning Services: CM Consult   Living arrangements for the past 2 months: Single Family Home                                       Social Determinants of Health (SDOH) Interventions    Readmission Risk Interventions No flowsheet data found.

## 2019-01-21 NOTE — Progress Notes (Addendum)
Physical Therapy Treatment Patient Details Name: Kenneth Watts MRN: [id number] DOB: 30-Jan-1946 Today's Date: 01/21/2019    History of Present Illness 73 yo male s/p L THA posterior approach on 01/19/19. PMH includes anxiety, AVN bilateral hips, COPD, diverticulitis, HTN, OSA, schizophrenia.    PT Comments    Pt progressing well. Continues to require cues for THP and overall safety. Will benefit  From SNF;  Pt  HR 111--122.   Follow Up Recommendations  Follow surgeon's recommendation for DC plan and follow-up therapies;Supervision for mobility/OOB(needs SNF)     Equipment Recommendations  None recommended by PT(defer to SNF)    Recommendations for Other Services       Precautions / Restrictions Precautions Precautions: Fall;Posterior Hip Precaution Comments: handout administered, reviewed, PT demonstrating precautions (no hip flexion >90*, no hip adduction >0*, no hip IR past midline, no crossing LEs) and reinforcing precautions during pt mobility Restrictions Weight Bearing Restrictions: No Other Position/Activity Restrictions: WBAT    Mobility  Bed Mobility               General bed mobility comments: in recliner on arrival   Transfers Overall transfer level: Needs assistance Equipment used: Rolling walker (2 wheeled) Transfers: Sit to/from Stand Sit to Stand: Min assist;Min guard         General transfer comment: light assist to rise and transition to RW. cues for hand placement and THP  Ambulation/Gait Ambulation/Gait assistance: Min guard Gait Distance (Feet): 60 Feet Assistive device: Rolling walker (2 wheeled) Gait Pattern/deviations: Step-to pattern;Decreased stance time - left;Decreased weight shift to left;Antalgic;Decreased step length - right;Decreased step length - left Gait velocity: decr   General Gait Details: Min guard for safety. Verbal cuing for sequencing, placement in RW, L LE 0* IR, cues to turn toward R side to avoid L hip  IR   Stairs             Wheelchair Mobility    Modified Rankin (Stroke Patients Only)       Balance                                            Cognition Arousal/Alertness: Awake/alert Behavior During Therapy: WFL for tasks assessed/performed;Flat affect Overall Cognitive Status: Within Functional Limits for tasks assessed                                        Exercises Total Joint Exercises Ankle Circles/Pumps: AROM;Both;10 reps Quad Sets: AROM;Both;10 reps Heel Slides: AAROM;Left;10 reps Hip ABduction/ADduction: AAROM;Left;10 reps    General Comments        Pertinent Vitals/Pain Pain Assessment: 0-10 Pain Score: 3  Pain Location: L hip Pain Descriptors / Indicators: Sore Pain Intervention(s): Limited activity within patient's tolerance;Monitored during session    Home Living                      Prior Function            PT Goals (current goals can now be found in the care plan section) Acute Rehab PT Goals Patient Stated Goal: walk better PT Goal Formulation: With patient Time For Goal Achievement: 01/26/19 Potential to Achieve Goals: Good Progress towards PT goals: Progressing toward goals    Frequency    7X/week  PT Plan Current plan remains appropriate    Co-evaluation              AM-PAC PT "6 Clicks" Mobility   Outcome Measure  Help needed turning from your back to your side while in a flat bed without using bedrails?: A Little Help needed moving from lying on your back to sitting on the side of a flat bed without using bedrails?: A Little Help needed moving to and from a bed to a chair (including a wheelchair)?: A Little Help needed standing up from a chair using your arms (e.g., wheelchair or bedside chair)?: A Little Help needed to walk in hospital room?: A Little Help needed climbing 3-5 steps with a railing? : A Lot 6 Click Score: 17    End of Session Equipment  Utilized During Treatment: Gait belt Activity Tolerance: Patient tolerated treatment well;Patient limited by fatigue Patient left: in chair;with call bell/phone within reach;with chair alarm set   PT Visit Diagnosis: Other abnormalities of gait and mobility (R26.89);Difficulty in walking, not elsewhere classified (R26.2)     Time: [id number] PT Time Calculation (min) (ACUTE ONLY): 21 min  Charges:  $Gait Training: 8-22 mins                     [name], PT  Pager: [phone] Acute Rehab Dept [facility]): [id number]   01/21/2019    [facility] 01/21/2019, 10:44 AM

## 2019-01-21 NOTE — Progress Notes (Signed)
     Subjective:  Patient reports pain as mild.  Overall feeling well, wants to go to SNF.  Objective:   VITALS:   Vitals:   01/20/19 2144 01/21/19 0604 01/21/19 0850 01/21/19 0859  BP:  (!) 161/77 (!) 147/72   Pulse: 65 (!) 58 62 63  Resp: 16 18  17   Temp:  97.9 F (36.6 C)  97.9 F (36.6 C)  TempSrc:  Oral  Oral  SpO2: 98% 100%  99%  Weight:      Height:        Neurologically intact Dorsiflexion/Plantar flexion intact Incision: no drainage   Lab Results  Component Value Date   WBC 11.3 (H) 01/21/2019   HGB 11.8 (L) 01/21/2019   HCT 37.4 (L) 01/21/2019   MCV 94.7 01/21/2019   PLT 163 01/21/2019   BMET    Component Value Date/Time   NA 141 01/21/2019 0256   K 4.3 01/21/2019 0256   CL 109 01/21/2019 0256   CO2 26 01/21/2019 0256   GLUCOSE 123 (H) 01/21/2019 0256   BUN 16 01/21/2019 0256   CREATININE 0.89 01/21/2019 0256   CREATININE 1.27 08/06/2013 1431   CALCIUM 8.6 (L) 01/21/2019 0256   GFRNONAA >60 01/21/2019 0256   GFRAA >60 01/21/2019 0256     Assessment/Plan: 2 Days Post-Op   Principal Problem:   Primary localized osteoarthritis of left hip   Advance diet Up with therapy Discharge to SNF   Anticipated LOS equal to or greater than 2 midnights due to - Age 74 and older with one or more of the following:  - Obesity  - Expected need for hospital services (PT, OT, Nursing) required for safe  discharge  - Anticipated need for postoperative skilled nursing care or inpatient rehab  - Active co-morbidities: Anemia and schizophrenia OR   - Unanticipated findings during/Post Surgery: Slow post-op progression: GI, pain control, mobility  - Patient is a high risk of re-admission due to: Barriers to post-acute care (logistical, no family support in home)     Kenneth Watts 01/21/2019, 12:55 PM   Marchia Bond, MD Cell [phone]

## 2019-01-22 LAB — CBC
HCT: 36.2 % — ABNORMAL LOW (ref 39.0–52.0)
Hemoglobin: 11.4 g/dL — ABNORMAL LOW (ref 13.0–17.0)
MCH: 30.2 pg (ref 26.0–34.0)
MCHC: 31.5 g/dL (ref 30.0–36.0)
MCV: 96 fL (ref 80.0–100.0)
Platelets: 163 10*3/uL (ref 150–400)
RBC: 3.77 MIL/uL — ABNORMAL LOW (ref 4.22–5.81)
RDW: 12.7 % (ref 11.5–15.5)
WBC: 8 10*3/uL (ref 4.0–10.5)
nRBC: 0 % (ref 0.0–0.2)

## 2019-01-22 NOTE — Discharge Summary (Addendum)
Physician Discharge Summary  Patient ID: Kenneth Watts MRN: [id number] DOB/AGE: December 21, 1945 73 y.o.  Admit date: 01/19/2019 Discharge date: 01/27/2019  Admission Diagnoses:  Primary localized osteoarthritis of left hip  Discharge Diagnoses:  Principal Problem:   Primary localized osteoarthritis of left hip   Past Medical History:  Diagnosis Date  . Anxiety   . Avascular necrosis of bones of both hips ([place])   . COPD (chronic obstructive pulmonary disease) (H[facility])    no inhalers used  . Diverticulitis    no recent problems  . Hypertension   . Melanoma ([place]) 2014   left arm  . OSA (obstructive sleep apnea)   . Oxygen dependent    3 liters with cpap set on 5 at hs  . Primary localized osteoarthritis of left hip 01/19/2019  . Schizophrenia ([place])    "paranoid schizopherneic with serois anger issues, 3 personalities, multiple psych issues"    Surgeries: Procedure(s): TOTAL HIP ARTHROPLASTY on 01/19/2019   Consultants (if any):   Discharged Condition: Improved  Hospital Course: Kenneth Watts is an 73 y.o. male who was admitted 01/19/2019 with a diagnosis of Primary localized osteoarthritis of left hip and went to the operating room on 01/19/2019 and underwent the above named procedures.    He was given perioperative antibiotics:  Anti-infectives (From admission, onward)   Start     Dose/Rate Route Frequency Ordered Stop   01/19/19 1400  ceFAZolin (ANCEF) IVPB 2g/100 mL premix     2 g 200 mL/hr over 30 Minutes Intravenous Every 6 hours 01/19/19 1048 01/19/19 2034   01/19/19 0600  ceFAZolin (ANCEF) IVPB 2g/100 mL premix     2 g 200 mL/hr over 30 Minutes Intravenous On call to O.R. 01/19/19 [id number] 01/19/19 0740    .  He was given sequential compression devices, early ambulation, and aspirin for DVT prophylaxis.  He benefited maximally from the hospital stay and there were no complications.  Discharge to SNF after peer to peer authorization was done, no home support with underlying  schizophrenia.  Recent vital signs:  Vitals:   01/26/19 2135 01/26/19 2143  BP: 132/70   Pulse: 78   Resp:  16  Temp:  98.5 F (36.9 C)  SpO2: 94%     Recent laboratory studies:  Lab Results  Component Value Date   HGB 11.4 (L) 01/22/2019   HGB 11.8 (L) 01/21/2019   HGB 11.9 (L) 01/20/2019   Lab Results  Component Value Date   WBC 8.0 01/22/2019   PLT 163 01/22/2019   Lab Results  Component Value Date   INR 1.0 01/19/2019   Lab Results  Component Value Date   NA 141 01/21/2019   K 4.3 01/21/2019   CL 109 01/21/2019   CO2 26 01/21/2019   BUN 16 01/21/2019   CREATININE 0.89 01/21/2019   GLUCOSE 123 (H) 01/21/2019    Discharge Medications:   Allergies as of 01/27/2019      Reactions   Lisinopril Other (See Comments)   Cough      Medication List    STOP taking these medications   aspirin 81 MG chewable tablet Replaced by: aspirin EC 325 MG tablet     TAKE these medications   aspirin EC 325 MG tablet Take 1 tablet (325 mg total) by mouth 2 (two) times daily. Replaces: aspirin 81 MG chewable tablet   baclofen 10 MG tablet Commonly known as: LIORESAL Take 1 tablet (10 mg total) by mouth 3 (three) times daily. As needed  for muscle spasm   clomiPHENE 50 MG tablet Commonly known as: CLOMID Take 25 mg by mouth every evening.   HYDROcodone-acetaminophen 10-325 MG tablet Commonly known as: Norco Take 1 tablet by mouth every 6 (six) hours as needed.   levocetirizine 5 MG tablet Commonly known as: XYZAL Take 5 mg by mouth every evening.   losartan-hydrochlorothiazide 50-12.5 MG tablet Commonly known as: HYZAAR Take 1 tablet by mouth daily.   LUTEIN PO Take 1 capsule by mouth daily.   multivitamin tablet Take 1 tablet by mouth daily.   ondansetron 4 MG tablet Commonly known as: Zofran Take 1 tablet (4 mg total) by mouth every 8 (eight) hours as needed for nausea or vomiting.   QUEtiapine 50 MG tablet Commonly known as: SEROQUEL Take 50 mg by  mouth at bedtime.   Resveratrol 100 MG Caps Take 100 mg by mouth daily.   sennosides-docusate sodium 8.6-50 MG tablet Commonly known as: SENOKOT-S Take 2 tablets by mouth daily.   ziprasidone 80 MG capsule Commonly known as: GEODON Take 80 mg by mouth every evening.   ziprasidone 60 MG capsule Commonly known as: GEODON Take 60 mg by mouth every evening.       Diagnostic Studies: Dg Pelvis Portable  Result Date: 01/19/2019 CLINICAL DATA:  Left total hip arthroplasty. EXAM: PORTABLE PELVIS 1-2 VIEWS; DG HIP (WITH OR WITHOUT PELVIS) 1V PORT LEFT COMPARISON:  None. FINDINGS: The left hip demonstrates a total arthroplasty without evidence of hardware failure or complication. There is expected intra-articular air. There is no fracture or dislocation. The alignment is anatomic. Post-surgical changes noted in the surrounding soft tissues. IMPRESSION: 1. Left total hip arthroplasty without acute postoperative complication. Electronically Signed   By: Titus Dubin M.D.   On: 01/19/2019 10:53   Dg Hip Port Unilat With Pelvis 1v Left  Result Date: 01/19/2019 CLINICAL DATA:  Left total hip arthroplasty. EXAM: PORTABLE PELVIS 1-2 VIEWS; DG HIP (WITH OR WITHOUT PELVIS) 1V PORT LEFT COMPARISON:  None. FINDINGS: The left hip demonstrates a total arthroplasty without evidence of hardware failure or complication. There is expected intra-articular air. There is no fracture or dislocation. The alignment is anatomic. Post-surgical changes noted in the surrounding soft tissues. IMPRESSION: 1. Left total hip arthroplasty without acute postoperative complication. Electronically Signed   By: Titus Dubin M.D.   On: 01/19/2019 10:53    Disposition: Discharge disposition: 03-Skilled Nursing Facility       Discharge Instructions    Call MD / Call 911   Complete by: As directed    If you experience chest pain or shortness of breath, CALL 911 and be transported to the hospital emergency room.  If you  develope a fever above 101 F, pus (white drainage) or increased drainage or redness at the wound, or calf pain, call your surgeon's office.   Constipation Prevention   Complete by: As directed    Drink plenty of fluids.  Prune juice may be helpful.  You may use a stool softener, such as Colace (over the counter) 100 mg twice a day.  Use MiraLax (over the counter) for constipation as needed.   Diet - low sodium heart healthy   Complete by: As directed    Discharge instructions   Complete by: As directed    INSTRUCTIONS AFTER JOINT REPLACEMENT   Remove items at home which could result in a fall. This includes throw rugs or furniture in walking pathways ICE to the affected joint every three hours while awake for  30 minutes at a time, for at least the first 3-5 days, and then as needed for pain and swelling.  Continue to use ice for pain and swelling. You may notice swelling that will progress down to the foot and ankle.  This is normal after surgery.  Elevate your leg when you are not up walking on it.   Continue to use the breathing machine you got in the hospital (incentive spirometer) which will help keep your temperature down.  It is common for your temperature to cycle up and down following surgery, especially at night when you are not up moving around and exerting yourself.  The breathing machine keeps your lungs expanded and your temperature down.   DIET:  As you were doing prior to hospitalization, we recommend a well-balanced diet.  DRESSING / WOUND CARE / SHOWERING  Keep the surgical dressing until follow up.  The dressing is water proof, so you can shower without any extra covering.  IF THE DRESSING FALLS OFF or the wound gets wet inside, change the dressing with sterile gauze.  Please use good hand washing techniques before changing the dressing.  Do not use any lotions or creams on the incision until instructed by your surgeon.    ACTIVITY  Increase activity slowly as tolerated, but  follow the weight bearing instructions below.   No driving for 6 weeks or until further direction given by your physician.  You cannot drive while taking narcotics.  No lifting or carrying greater than 10 lbs. until further directed by your surgeon. Avoid periods of inactivity such as sitting longer than an hour when not asleep. This helps prevent blood clots.  You may return to work once you are authorized by your doctor.     WEIGHT BEARING   Weight bearing as tolerated with assist device (walker, cane, etc) as directed, use it as long as suggested by your surgeon or therapist, typically at least 2-3 weeks.   EXERCISES  Results after joint replacement surgery are often greatly improved when you follow the exercise, range of motion and muscle strengthening exercises prescribed by your doctor. Safety measures are also important to protect the joint from further injury. Any time any of these exercises cause you to have increased pain or swelling, decrease what you are doing until you are comfortable again and then slowly increase them. If you have problems or questions, call your caregiver or physical therapist for advice.   Rehabilitation is important following a joint replacement. After just a few days of immobilization, the muscles of the leg can become weakened and shrink (atrophy).  These exercises are designed to build up the tone and strength of the thigh and leg muscles and to improve motion. Often times heat used for twenty to thirty minutes before working out will loosen up your tissues and help with improving the range of motion but do not use heat for the first two weeks following surgery (sometimes heat can increase post-operative swelling).   These exercises can be done on a training (exercise) mat, on the floor, on a table or on a bed. Use whatever works the best and is most comfortable for you.    Use music or television while you are exercising so that the exercises are a pleasant  break in your day. This will make your life better with the exercises acting as a break in your routine that you can look forward to.   Perform all exercises about fifteen times, three times per day  or as directed.  You should exercise both the operative leg and the other leg as well.   Exercises include:  Quad Sets - Tighten up the muscle on the front of the thigh (Quad) and hold for 5-10 seconds.   Straight Leg Raises - With your knee straight (if you were given a brace, keep it on), lift the leg to 60 degrees, hold for 3 seconds, and slowly lower the leg.  Perform this exercise against resistance later as your leg gets stronger.  Leg Slides: Lying on your back, slowly slide your foot toward your buttocks, bending your knee up off the floor (only go as far as is comfortable). Then slowly slide your foot back down until your leg is flat on the floor again.  Angel Wings: Lying on your back spread your legs to the side as far apart as you can without causing discomfort.  Hamstring Strength:  Lying on your back, push your heel against the floor with your leg straight by tightening up the muscles of your buttocks.  Repeat, but this time bend your knee to a comfortable angle, and push your heel against the floor.  You may put a pillow under the heel to make it more comfortable if necessary.   A rehabilitation program following joint replacement surgery can speed recovery and prevent re-injury in the future due to weakened muscles. Contact your doctor or a physical therapist for more information on knee rehabilitation.    CONSTIPATION  Constipation is defined medically as fewer than three stools per week and severe constipation as less than one stool per week.  Even if you have a regular bowel pattern at home, your normal regimen is likely to be disrupted due to multiple reasons following surgery.  Combination of anesthesia, postoperative narcotics, change in appetite and fluid intake all can affect your  bowels.   YOU MUST use at least one of the following options; they are listed in order of increasing strength to get the job done.  They are all available over the counter, and you may need to use some, POSSIBLY even all of these options:    Drink plenty of fluids (prune juice may be helpful) and high fiber foods Colace 100 mg by mouth twice a day  Senokot for constipation as directed and as needed Dulcolax (bisacodyl), take with full glass of water  Miralax (polyethylene glycol) once or twice a day as needed.  If you have tried all these things and are unable to have a bowel movement in the first 3-4 days after surgery call either your surgeon or your primary doctor.    If you experience loose stools or diarrhea, hold the medications until you stool forms back up.  If your symptoms do not get better within 1 week or if they get worse, check with your doctor.  If you experience "the worst abdominal pain ever" or develop nausea or vomiting, please contact the office immediately for further recommendations for treatment.   ITCHING:  If you experience itching with your medications, try taking only a single pain pill, or even half a pain pill at a time.  You can also use Benadryl over the counter for itching or also to help with sleep.   TED HOSE STOCKINGS:  Use stockings on both legs until for at least 2 weeks or as directed by physician office. They may be removed at night for sleeping.  MEDICATIONS:  See your medication summary on the "After Visit Summary" that nursing will  review with you.  You may have some home medications which will be placed on hold until you complete the course of blood thinner medication.  It is important for you to complete the blood thinner medication as prescribed.  PRECAUTIONS:  If you experience chest pain or shortness of breath - call 911 immediately for transfer to the hospital emergency department.   If you develop a fever greater that 101 F, purulent drainage  from wound, increased redness or drainage from wound, foul odor from the wound/dressing, or calf pain - CONTACT YOUR SURGEON.                                                   FOLLOW-UP APPOINTMENTS:  If you do not already have a post-op appointment, please call the office for an appointment to be seen by your surgeon.  Guidelines for how soon to be seen are listed in your "After Visit Summary", but are typically between 1-4 weeks after surgery.  OTHER INSTRUCTIONS:   Knee Replacement:  Do not place pillow under knee, focus on keeping the knee straight while resting. CPM instructions: 0-90 degrees, 2 hours in the morning, 2 hours in the afternoon, and 2 hours in the evening. Place foam block, curve side up under heel at all times except when in CPM or when walking.  DO NOT modify, tear, cut, or change the foam block in any way.  MAKE SURE YOU:  Understand these instructions.  Get help right away if you are not doing well or get worse.    Thank you for letting us be a part of your medical care team.  It is a privilege we respect greatly.  We hope these instructions will help you stay on track for a fast and full recovery!   Follow the hip precautions as taught in Physical Therapy   Complete by: As directed    Posterior total hip precautions   Posterior total hip precautions   Increase activity slowly as tolerated   Complete by: As directed    Patient may shower   Complete by: As directed    Aquacel dressing is water proof    Wash over it and the whole leg with soap and water at the end of your shower       Contact information for follow-up providers    Marchia Bond, MD. Schedule an appointment as soon as possible for a visit in 2 weeks.   Specialty: Orthopedic Surgery Contact information: [address] [place] [address] [phone]            Contact information for after-discharge care    Destination    [facility] .    Service: Skilled Nursing Contact information: [address] [place] [phone]                   Signed: Johnny Bridge 01/27/2019, 11:22 AM

## 2019-01-22 NOTE — Care Management Important Message (Signed)
Important Message  Patient Details IM Letter given to Velva Harman RN to present to the Patient Name: Kenneth Watts MRN: [id number] Date of Birth: 06/27/1945   Medicare Important Message Given:  Yes     Kerin Salen 01/22/2019, 11:15 AM

## 2019-01-22 NOTE — Progress Notes (Signed)
     Events yesterday: I received a call regarding the denial for his skilled nursing application.  I placed a call to his insurance company, requesting a peer to peer.  They informed me that that would be scheduled for September 3, yesterday afternoon, between 3:00 and 430.  They never returned my call.  I will await further instructions from them, hopefully they actually respond to my inquiry.  I do feel that it is in his best interest to go to skilled nursing facility for period of 1 to 2 weeks, given his lack of social support, his schizophrenia, high risk for falls, and inability to care for himself.  He is in a difficult situation, with advancing age, decreasing function, not sure what his long-term support plan will be.   Subjective:  Patient reports pain as mild.  Sitting up eating breakfast.  He walked 60 feet yesterday, but is very afraid of going home because he has no home care and no home support.  He reports not feeling safe getting up and getting around, and does not feel like he can walk very well.  Objective:   VITALS:   Vitals:   01/21/19 0859 01/21/19 1300 01/21/19 2048 01/22/19 0441  BP:  (!) 149/69 119/60 (!) 144/76  Pulse: 63 63 66 (!) 56  Resp: 17 17 16 14   Temp: 97.9 F (36.6 C) 97.8 F (36.6 C) 98 F (36.7 C) 97.8 F (36.6 C)  TempSrc: Oral Oral Oral Oral  SpO2: 99% 100% 96% 100%  Weight:      Height:        Neurologically intact Dressing is clean, sensation intact distally, EHL and FHL intact.  Lab Results  Component Value Date   WBC 8.0 01/22/2019   HGB 11.4 (L) 01/22/2019   HCT 36.2 (L) 01/22/2019   MCV 96.0 01/22/2019   PLT 163 01/22/2019   BMET    Component Value Date/Time   NA 141 01/21/2019 0256   K 4.3 01/21/2019 0256   CL 109 01/21/2019 0256   CO2 26 01/21/2019 0256   GLUCOSE 123 (H) 01/21/2019 0256   BUN 16 01/21/2019 0256   CREATININE 0.89 01/21/2019 0256   CREATININE 1.27 08/06/2013 1431   CALCIUM 8.6 (L) 01/21/2019 0256   GFRNONAA >60 01/21/2019 0256   GFRAA >60 01/21/2019 0256     Assessment/Plan: 3 Days Post-Op   Principal Problem:   Primary localized osteoarthritis of left hip   Discharge to SNF pending reconciliation of the skilled nursing denial, I will hopefully hear from them today with a peer to peer.  They have my cell phone.   Anticipated LOS equal to or greater than 2 midnights due to - Age 57 and older with one or more of the following:  - Obesity  - Expected need for hospital services (PT, OT, Nursing) required for safe  discharge  - Anticipated need for postoperative skilled nursing care or inpatient rehab  - Active co-morbidities: Anemia and Schizophrenia with lack of home support OR   - Unanticipated findings during/Post Surgery: Slow post-op progression: GI, pain control, mobility  - Patient is a high risk of re-admission due to: Barriers to post-acute care (logistical, no family support in home)     Kenneth Watts 01/22/2019, 8:13 AM   Marchia Bond, MD Cell [phone]

## 2019-01-22 NOTE — Plan of Care (Signed)
  Problem: Pain Management: Goal: Pain level will decrease with appropriate interventions Outcome: Progressing   Problem: Elimination: Goal: Will not experience complications related to bowel motility Outcome: Progressing   Problem: Safety: Goal: Ability to remain free from injury will improve Outcome: Progressing

## 2019-01-22 NOTE — TOC Progression Note (Addendum)
Transition of Care [facility]) - Progression Note    Patient Details  Name: Kenneth Watts MRN: [id number] Date of Birth: 20-Apr-1946  Transition of Care [facility]) CM/SW Contact  Leeroy Cha, RN Phone Number: 01/22/2019, 3:41 PM  Clinical Narrative:    tcf-Dr. Nigel Berthold -just got off the phone with [facility] and stay is approved.  TCT-[facility] at [facility].  Message left to call back.  Have room number has [phone] and to call reprt to [phone] Do not have passar number as of 1600. Patient can not go until passar obtained. Call report to Peggy-[phone] on Monday or Meg at [phone] if over the weekend. Expected Discharge Plan: Skilled Nursing Facility Barriers to Discharge: Continued Medical Work up, Programmer, applications (PASRR), Insurance Authorization  Expected Discharge Plan and Services Expected Discharge Plan: [place] In-house Referral: Clinical Social Work Discharge Planning Services: CM Consult   Living arrangements for the past 2 months: Single Family Home                                       Social Determinants of Health (SDOH) Interventions    Readmission Risk Interventions No flowsheet data found.

## 2019-01-22 NOTE — TOC Progression Note (Signed)
Transition of Care [facility]) - Progression Note    Patient Details  Name: Kenneth Watts MRN: [id number] Date of Birth: 1945/11/12  Transition of Care [facility]) CM/SW [place], [place] Phone Number: 01/22/2019, 10:33 AM  Clinical Narrative:    Physician waiting for a return phone call from [place] to complete a Peer to Peer.  SNF faxed copy of MD recent progress note over to [facility].     Expected Discharge Plan: Skilled Nursing Facility Barriers to Discharge: Continued Medical Work up, Programmer, applications (PASRR), Insurance Authorization  Expected Discharge Plan and Services Expected Discharge Plan: [place] In-house Referral: Clinical Social Work Discharge Planning Services: CM Consult   Living arrangements for the past 2 months: Single Family Home                                       Social Determinants of Health (SDOH) Interventions    Readmission Risk Interventions No flowsheet data found.

## 2019-01-22 NOTE — Progress Notes (Signed)
Physical Therapy Treatment Patient Details Name: Kenneth Watts MRN: [id number] DOB: [birth year] Today's Date: 01/22/2019    History of Present Illness 73 yo male s/p L THA posterior approach on 01/19/19. PMH includes anxiety, AVN bilateral hips, COPD, diverticulitis, HTN, OSA, schizophrenia.    PT Comments    Pt continues very cooperative but progressing slowly and fatigues easily.   Follow Up Recommendations  Follow surgeon's recommendation for DC plan and follow-up therapies;Supervision for mobility/OOB     Equipment Recommendations  None recommended by PT    Recommendations for Other Services       Precautions / Restrictions Precautions Precautions: Fall;Posterior Hip Precaution Booklet Issued: Yes (comment) Precaution Comments: handout administered, reviewed, PT demonstrating precautions (no hip flexion >90*, no hip adduction >0*, no hip IR past midline, no crossing LEs) and reinforcing precautions during pt mobility Restrictions Weight Bearing Restrictions: No Other Position/Activity Restrictions: WBAT    Mobility  Bed Mobility Overal bed mobility: Needs Assistance Bed Mobility: Supine to Sit     Supine to sit: Min assist;HOB elevated     General bed mobility comments: Pt up in chair and requests back to same  Transfers Overall transfer level: Needs assistance Equipment used: Rolling walker (2 wheeled) Transfers: Sit to/from Stand Sit to Stand: Min assist;Min guard;From elevated surface         General transfer comment: light assist to rise and transition to RW. cues for hand placement and THP  Ambulation/Gait Ambulation/Gait assistance: Min guard Gait Distance (Feet): 60 Feet Assistive device: Rolling walker (2 wheeled) Gait Pattern/deviations: Step-to pattern;Decreased stance time - left;Decreased weight shift to left;Antalgic;Decreased step length - right;Decreased step length - left Gait velocity: decr   General Gait Details: Min guard for safety.  Verbal cuing for sequencing, placement in RW, L LE 0* IR, cues to turn toward R side to avoid L hip IR; Distance ltd by fatigue   Stairs             Wheelchair Mobility    Modified Rankin (Stroke Patients Only)       Balance Overall balance assessment: Needs assistance;History of Falls Sitting-balance support: No upper extremity supported;Feet supported Sitting balance-Leahy Scale: Good     Standing balance support: Bilateral upper extremity supported Standing balance-Leahy Scale: Poor Standing balance comment: reliant on external support in standing                            Cognition Arousal/Alertness: Awake/alert Behavior During Therapy: WFL for tasks assessed/performed;Flat affect Overall Cognitive Status: Within Functional Limits for tasks assessed                                        Exercises Total Joint Exercises Ankle Circles/Pumps: AROM;Both;10 reps Quad Sets: AROM;Both;10 reps Heel Slides: AAROM;Left;20 reps Hip ABduction/ADduction: AAROM;Left;15 reps    General Comments        Pertinent Vitals/Pain Pain Assessment: 0-10 Pain Score: 3  Pain Location: L hip Pain Descriptors / Indicators: Sore Pain Intervention(s): Limited activity within patient's tolerance;Monitored during session;Premedicated before session;Ice applied    Home Living                      Prior Function            PT Goals (current goals can now be found in the care plan section) Acute Rehab  PT Goals Patient Stated Goal: walk better PT Goal Formulation: With patient Time For Goal Achievement: 01/26/19 Potential to Achieve Goals: Good Progress towards PT goals: Progressing toward goals    Frequency    7X/week      PT Plan Current plan remains appropriate    Co-evaluation              AM-PAC PT "6 Clicks" Mobility   Outcome Measure  Help needed turning from your back to your side while in a flat bed without using  bedrails?: A Little Help needed moving from lying on your back to sitting on the side of a flat bed without using bedrails?: A Little Help needed moving to and from a bed to a chair (including a wheelchair)?: A Little Help needed standing up from a chair using your arms (e.g., wheelchair or bedside chair)?: A Little Help needed to walk in hospital room?: A Little Help needed climbing 3-5 steps with a railing? : A Lot 6 Click Score: 17    End of Session Equipment Utilized During Treatment: Gait belt Activity Tolerance: Patient tolerated treatment well;Patient limited by fatigue Patient left: in chair;with call bell/phone within reach;with chair alarm set Nurse Communication: Mobility status PT Visit Diagnosis: Other abnormalities of gait and mobility (R26.89);Difficulty in walking, not elsewhere classified (R26.2)     Time: 1439-1500 PT Time Calculation (min) (ACUTE ONLY): 21 min  Charges:  $Gait Training: 8-22 mins $Therapeutic Exercise: 8-22 mins                     Debe Coder PT Acute Rehabilitation Services Pager [phone] Office [phone]    Avraj Lindroth 01/22/2019, 3:14 PM

## 2019-01-22 NOTE — Progress Notes (Signed)
Physical Therapy Treatment Patient Details Name: Kenneth Watts MRN: [id number] DOB: 1945-06-10 Today's Date: 01/22/2019    History of Present Illness 73 yo male s/p L THA posterior approach on 01/19/19. PMH includes anxiety, AVN bilateral hips, COPD, diverticulitis, HTN, OSA, schizophrenia.    PT Comments    Pt motivated and progressing slowly with mobility but continues to require assistance for safe performance of all mobility tasks.  Pt would benefit from follow up rehab at SNF level to maximize IND and safety prior to dc home with very limited assist.   Follow Up Recommendations  Follow surgeon's recommendation for DC plan and follow-up therapies;Supervision for mobility/OOB     Equipment Recommendations  None recommended by PT    Recommendations for Other Services       Precautions / Restrictions Precautions Precautions: Fall;Posterior Hip Precaution Comments: handout administered, reviewed, PT demonstrating precautions (no hip flexion >90*, no hip adduction >0*, no hip IR past midline, no crossing LEs) and reinforcing precautions during pt mobility Restrictions Weight Bearing Restrictions: No Other Position/Activity Restrictions: WBAT    Mobility  Bed Mobility Overal bed mobility: Needs Assistance Bed Mobility: Supine to Sit     Supine to sit: Min assist;HOB elevated     General bed mobility comments: Increased time, cues for sequence, assist for L LE. extensive use of bedrails  Transfers Overall transfer level: Needs assistance Equipment used: Rolling walker (2 wheeled) Transfers: Sit to/from Stand Sit to Stand: Min assist;Min guard;From elevated surface         General transfer comment: light assist to rise and transition to RW. cues for hand placement and THP  Ambulation/Gait Ambulation/Gait assistance: Min guard Gait Distance (Feet): 60 Feet Assistive device: Rolling walker (2 wheeled) Gait Pattern/deviations: Step-to pattern;Decreased stance time -  left;Decreased weight shift to left;Antalgic;Decreased step length - right;Decreased step length - left Gait velocity: decr   General Gait Details: Min guard for safety. Verbal cuing for sequencing, placement in RW, L LE 0* IR, cues to turn toward R side to avoid L hip IR; Distance ltd by fatigue   Stairs             Wheelchair Mobility    Modified Rankin (Stroke Patients Only)       Balance Overall balance assessment: Needs assistance;History of Falls Sitting-balance support: No upper extremity supported;Feet supported Sitting balance-Leahy Scale: Good     Standing balance support: Bilateral upper extremity supported Standing balance-Leahy Scale: Poor Standing balance comment: reliant on external support in standing                            Cognition Arousal/Alertness: Awake/alert Behavior During Therapy: WFL for tasks assessed/performed;Flat affect Overall Cognitive Status: Within Functional Limits for tasks assessed                                        Exercises Total Joint Exercises Ankle Circles/Pumps: AROM;Both;10 reps Quad Sets: AROM;Both;10 reps Heel Slides: AAROM;Left;20 reps Hip ABduction/ADduction: AAROM;Left;15 reps    General Comments        Pertinent Vitals/Pain Pain Assessment: 0-10 Pain Score: 3  Pain Location: L hip Pain Descriptors / Indicators: Sore Pain Intervention(s): Limited activity within patient's tolerance;Monitored during session;Premedicated before session    Home Living  Prior Function            PT Goals (current goals can now be found in the care plan section) Acute Rehab PT Goals Patient Stated Goal: walk better PT Goal Formulation: With patient Time For Goal Achievement: 01/26/19 Potential to Achieve Goals: Good Progress towards PT goals: Progressing toward goals    Frequency    7X/week      PT Plan Current plan remains appropriate     Co-evaluation              AM-PAC PT "6 Clicks" Mobility   Outcome Measure  Help needed turning from your back to your side while in a flat bed without using bedrails?: A Little Help needed moving from lying on your back to sitting on the side of a flat bed without using bedrails?: A Little Help needed moving to and from a bed to a chair (including a wheelchair)?: A Little Help needed standing up from a chair using your arms (e.g., wheelchair or bedside chair)?: A Little Help needed to walk in hospital room?: A Little Help needed climbing 3-5 steps with a railing? : A Lot 6 Click Score: 17    End of Session Equipment Utilized During Treatment: Gait belt Activity Tolerance: Patient tolerated treatment well;Patient limited by fatigue Patient left: in chair;with call bell/phone within reach;with chair alarm set Nurse Communication: Mobility status PT Visit Diagnosis: Other abnormalities of gait and mobility (R26.89);Difficulty in walking, not elsewhere classified (R26.2)     Time: 1115-1150 PT Time Calculation (min) (ACUTE ONLY): 35 min  Charges:  $Gait Training: 8-22 mins $Therapeutic Exercise: 8-22 mins                     Debe Coder PT Acute Rehabilitation Services Pager [phone] Office [phone]    Kenneth Watts 01/22/2019, 12:21 PM

## 2019-01-23 NOTE — Progress Notes (Signed)
Physical Therapy Treatment Patient Details Name: Kenneth Watts MRN: [id number] DOB: 05/06/1946 Today's Date: 01/23/2019    History of Present Illness 73 yo male s/p L THA posterior approach on 01/19/19. PMH includes anxiety, AVN bilateral hips, COPD, diverticulitis, HTN, OSA, schizophrenia.    PT Comments    Pt progressing. Continue to recommend SNF post acute. Continue PT POC  Follow Up Recommendations  Follow surgeon's recommendation for DC plan and follow-up therapies;Supervision for mobility/OOB     Equipment Recommendations  None recommended by PT    Recommendations for Other Services       Precautions / Restrictions Precautions Precautions: Fall;Posterior Hip Precaution Comments: reviewed posterior hip precautions  Restrictions Other Position/Activity Restrictions: WBAT    Mobility  Bed Mobility Overal bed mobility: Needs Assistance Bed Mobility: Supine to Sit     Supine to sit: Min assist;HOB elevated     General bed mobility comments: assist with LLE, cues for THP and technique  Transfers Overall transfer level: Needs assistance Equipment used: Rolling walker (2 wheeled) Transfers: Sit to/from Stand Sit to Stand: Min assist;Min guard         General transfer comment: light assist to rise and transition to RW. cues for hand placement and THP  Ambulation/Gait Ambulation/Gait assistance: Min guard Gait Distance (Feet): 70 Feet Assistive device: Rolling walker (2 wheeled) Gait Pattern/deviations: Step-to pattern;Decreased stance time - left;Decreased weight shift to left;Antalgic;Decreased step length - right;Decreased step length - left Gait velocity: decr   General Gait Details: Min guard for safety. Verbal cuing for sequencing, placement in RW, L LE 0* IR, improving carryover with avoiding IR   Stairs             Wheelchair Mobility    Modified Rankin (Stroke Patients Only)       Balance           Standing balance support:  Bilateral upper extremity supported Standing balance-Leahy Scale: Poor Standing balance comment: reliant on external support in standing                            Cognition Arousal/Alertness: Awake/alert Behavior During Therapy: WFL for tasks assessed/performed;Flat affect Overall Cognitive Status: Within Functional Limits for tasks assessed                                        Exercises Total Joint Exercises Ankle Circles/Pumps: AROM;Both;10 reps Quad Sets: AROM;Both;10 reps Heel Slides: AAROM;Left;10 reps Hip ABduction/ADduction: AAROM;Left;15 reps    General Comments        Pertinent Vitals/Pain Pain Assessment: 0-10 Pain Score: 2  Pain Location: L hip Pain Descriptors / Indicators: Sore Pain Intervention(s): Limited activity within patient's tolerance;Monitored during session;Premedicated before session;Repositioned    Home Living                      Prior Function            PT Goals (current goals can now be found in the care plan section) Acute Rehab PT Goals Patient Stated Goal: walk better PT Goal Formulation: With patient Time For Goal Achievement: 01/26/19 Potential to Achieve Goals: Good    Frequency    7X/week      PT Plan Current plan remains appropriate    Co-evaluation  AM-PAC PT "6 Clicks" Mobility   Outcome Measure  Help needed turning from your back to your side while in a flat bed without using bedrails?: A Little Help needed moving from lying on your back to sitting on the side of a flat bed without using bedrails?: A Little Help needed moving to and from a bed to a chair (including a wheelchair)?: A Little Help needed standing up from a chair using your arms (e.g., wheelchair or bedside chair)?: A Little Help needed to walk in hospital room?: A Little Help needed climbing 3-5 steps with a railing? : A Lot 6 Click Score: 17    End of Session Equipment Utilized During  Treatment: Gait belt Activity Tolerance: Patient tolerated treatment well;Patient limited by fatigue Patient left: in chair;with call bell/phone within reach;with chair alarm set   PT Visit Diagnosis: Other abnormalities of gait and mobility (R26.89);Difficulty in walking, not elsewhere classified (R26.2)     Time: [id number] PT Time Calculation (min) (ACUTE ONLY): 27 min  Charges:  $Gait Training: 8-22 mins $Therapeutic Exercise: 8-22 mins                     [name], PT  Pager: [phone] Acute Rehab Dept [facility]): [phone]   01/23/2019    [facility] 01/23/2019, 3:55 PM

## 2019-01-23 NOTE — Progress Notes (Addendum)
Per TOC CM's note on 9/6:  Clinical Narrative:    tcf-Dr. Nigel Berthold -just got off the phone with [facility] and stay is approved.  TCT-Whitney at [facility].  Message left to call back.  Have room number has [phone] and to call reprt to [phone] Do not have passar number as of 1600. Patient can not go until passar obtained. Call report to Peggy-[phone] on Monday or Meg at [phone] if over the weekend. Expected Discharge Plan: Skilled Nursing Facility Barriers to Discharge: Continued Medical Work up, Programmer, applications (PASRR), Ship broker.  CSW on 9/5 sees pt's PASRR has still not been obtained as of 9/4 and as [facility] is closed until Monday D/C is not possible until Monday 9/7.  RN updated.  Please reconsult if future social work needs arise.  CSW signing off, as social work intervention is no longer needed.  Alphonse Guild. Awa Bachicha, LCSW, LCAS, CSI Transitions of Care Clinical Social Worker Care Coordination Department Ph: [phone]

## 2019-01-23 NOTE — Progress Notes (Signed)
Physical Therapy Treatment Patient Details Name: Kenneth Watts MRN: [id number] DOB: 09/04/1945 Today's Date: 01/23/2019    History of Present Illness 73 yo male s/p L THA posterior approach on 01/19/19. PMH includes anxiety, AVN bilateral hips, COPD, diverticulitis, HTN, OSA, schizophrenia.    PT Comments    Pt cooperative, progressing, continues to require reinforcement for THP. Will benefit from SNF post acute   Follow Up Recommendations  Follow surgeon's recommendation for DC plan and follow-up therapies;Supervision for mobility/OOB     Equipment Recommendations  None recommended by PT    Recommendations for Other Services       Precautions / Restrictions Precautions Precautions: Fall;Posterior Hip Restrictions Other Position/Activity Restrictions: WBAT    Mobility  Bed Mobility Overal bed mobility: Needs Assistance Bed Mobility: Supine to Sit     Supine to sit: Min assist;HOB elevated     General bed mobility comments: assist with LLE, cues for THP  Transfers Overall transfer level: Needs assistance Equipment used: Rolling walker (2 wheeled) Transfers: Sit to/from Stand Sit to Stand: Min assist;Min guard         General transfer comment: light assist to rise and transition to RW. cues for hand placement and THP  Ambulation/Gait Ambulation/Gait assistance: Min guard Gait Distance (Feet): 60 Feet Assistive device: Rolling walker (2 wheeled) Gait Pattern/deviations: Step-to pattern;Decreased stance time - left;Decreased weight shift to left;Antalgic;Decreased step length - right;Decreased step length - left     General Gait Details: Min guard for safety. Verbal cuing for sequencing, placement in RW, L LE 0* IR, improving carryover with avoiding IR   Stairs             Wheelchair Mobility    Modified Rankin (Stroke Patients Only)       Balance                                            Cognition Arousal/Alertness:  Awake/alert Behavior During Therapy: WFL for tasks assessed/performed;Flat affect Overall Cognitive Status: Within Functional Limits for tasks assessed                                        Exercises Total Joint Exercises Ankle Circles/Pumps: AROM;Both;10 reps Quad Sets: AROM;Both;10 reps    General Comments        Pertinent Vitals/Pain Pain Assessment: 0-10 Pain Score: 4  Pain Location: L hip Pain Descriptors / Indicators: Sore Pain Intervention(s): Limited activity within patient's tolerance;Monitored during session    Home Living                      Prior Function            PT Goals (current goals can now be found in the care plan section) Acute Rehab PT Goals Patient Stated Goal: walk better PT Goal Formulation: With patient Time For Goal Achievement: 01/26/19 Potential to Achieve Goals: Good    Frequency    7X/week      PT Plan Current plan remains appropriate    Co-evaluation              AM-PAC PT "6 Clicks" Mobility   Outcome Measure  Help needed turning from your back to your side while in a flat bed without using bedrails?: A Little  Help needed moving from lying on your back to sitting on the side of a flat bed without using bedrails?: A Little Help needed moving to and from a bed to a chair (including a wheelchair)?: A Little Help needed standing up from a chair using your arms (e.g., wheelchair or bedside chair)?: A Little Help needed to walk in hospital room?: A Little Help needed climbing 3-5 steps with a railing? : A Lot 6 Click Score: 17    End of Session Equipment Utilized During Treatment: Gait belt Activity Tolerance: Patient tolerated treatment well;Patient limited by fatigue Patient left: in chair;with call bell/phone within reach;with chair alarm set   PT Visit Diagnosis: Other abnormalities of gait and mobility (R26.89);Difficulty in walking, not elsewhere classified (R26.2)     Time:  [id number] PT Time Calculation (min) (ACUTE ONLY): 17 min  Charges:  $Gait Training: 8-22 mins                     [name], PT  Pager: [phone] Acute Rehab Dept [facility]): [phone]   01/23/2019    [facility] 01/23/2019, 11:49 AM

## 2019-01-23 NOTE — Progress Notes (Signed)
SPORTS MEDICINE AND JOINT REPLACEMENT  Lara Mulch, MD    Carlyon Shadow, PA-C [place], [place],   [address]                             [phone]   PROGRESS NOTE  Subjective:  negative for Chest Pain  negative for Shortness of Breath  negative for Nausea/Vomiting   negative for Calf Pain  negative for Bowel Movement   Tolerating Diet: yes         Patient reports pain as 2 on 0-10 scale.    Objective: Vital signs in last 24 hours:    Patient Vitals for the past 24 hrs:  BP Temp Temp src Pulse Resp SpO2  01/23/19 0504 (!) 144/70 97.6 F (36.4 C) Oral 60 15 100 %  01/22/19 2057 116/64 97.8 F (36.6 C) Oral 72 16 96 %  01/22/19 1352 (!) 158/91 98.5 F (36.9 C) - 72 18 100 %    @flow {1959:LAST@   Intake/Output from previous day:   09/04 0701 - 09/05 0700 In: 1200 [P.O.:1200] Out: 1300 [Urine:1300]   Intake/Output this shift:   No intake/output data recorded.   Intake/Output      09/04 0701 - 09/05 0700 09/05 0701 - 09/06 0700   P.O. 1200    I.V. (mL/kg) 0 (0)    IV Piggyback 0    Total Intake(mL/kg) 1200 (10.1)    Urine (mL/kg/hr) 1300 (0.5)    Stool     Total Output 1300    Net -100            LABORATORY DATA: Recent Labs    01/20/19 0518 01/21/19 0256 01/22/19 0243  WBC 12.5* 11.3* 8.0  HGB 11.9* 11.8* 11.4*  HCT 37.0* 37.4* 36.2*  PLT 141* 163 163   Recent Labs    01/20/19 0518 01/21/19 0256  NA 138 141  K 4.0 4.3  CL 107 109  CO2 24 26  BUN 18 16  CREATININE 0.97 0.89  GLUCOSE 123* 123*  CALCIUM 8.6* 8.6*   Lab Results  Component Value Date   INR 1.0 01/19/2019    Examination:  General appearance: alert, cooperative and no distress Extremities: extremities normal, atraumatic, no cyanosis or edema  Wound Exam: clean, dry, intact   Drainage:  None: wound tissue dry  Motor Exam: Quadriceps and Hamstrings Intact  Sensory Exam: Superficial Peroneal, Deep Peroneal and Tibial normal   Assessment:    4  Days Post-Op  Procedure(s) (LRB): TOTAL HIP ARTHROPLASTY (Left)  ADDITIONAL DIAGNOSIS:  Principal Problem:   Primary localized osteoarthritis of left hip     Plan: Physical Therapy as ordered Weight Bearing as Tolerated (WBAT)  Patient is resting comfortably in bed, minimal pain. D/C to SNF is pending but has already been denied once. We do feel this is recommended because of his lack of social support, his schizophrenia, high risk for falls, and inability to care for himself. May D/C home this weekend if no other options. Will continue to follow      Anticipated LOS equal to or greater than 2 midnights due to - Age 41 and older with one or more of the following:  - Obesity  - Expected need for hospital services (PT, OT, Nursing) required for safe  discharge  - Anticipated need for postoperative skilled nursing care or inpatient rehab  -    Donia Ast 01/23/2019, 7:36 AM

## 2019-01-24 NOTE — Plan of Care (Signed)
  Problem: Pain Management: Goal: Pain level will decrease with appropriate interventions Outcome: Progressing   Problem: Elimination: Goal: Will not experience complications related to bowel motility Outcome: Progressing   Problem: Safety: Goal: Ability to remain free from injury will improve Outcome: Progressing

## 2019-01-24 NOTE — Progress Notes (Signed)
Subjective: 5 Days Post-Op Procedure(s) (LRB): TOTAL HIP ARTHROPLASTY (Left) Patient reports pain as 3 on 0-10 scale.    Objective: Vital signs in last 24 hours: Temp:  [98 F (36.7 C)-98.6 F (37 C)] 98.6 F (37 C) (09/06 1355) Pulse Rate:  [65-70] 69 (09/06 1355) Resp:  [16] 16 (09/06 1355) BP: (119-140)/(63-73) 119/63 (09/06 1355) SpO2:  [96 %-100 %] 96 % (09/06 1355)  Intake/Output from previous day: 09/05 0701 - 09/06 0700 In: 720 [P.O.:720] Out: 1400 [Urine:1400] Intake/Output this shift: Total I/O In: 720 [P.O.:720] Out: 500 [Urine:500]  Recent Labs    01/22/19 0243  HGB 11.4*   Recent Labs    01/22/19 0243  WBC 8.0  RBC 3.77*  HCT 36.2*  PLT 163   No results for input(s): NA, K, CL, CO2, BUN, CREATININE, GLUCOSE, CALCIUM in the last 72 hours. No results for input(s): LABPT, INR in the last 72 hours.  Neurovascular intact Sensation intact distally Intact pulses distally Incision: scant drainage   Assessment/Plan: 5 Days Post-Op Procedure(s) (LRB): TOTAL HIP ARTHROPLASTY (Left)  Patient is waiting on a PASSR in order to go to [place].  Encourage patient to get up and move around as much as possible   [place] 01/24/2019, 3:36 PM

## 2019-01-24 NOTE — Progress Notes (Signed)
Physical Therapy Treatment Patient Details Name: Kenneth Watts MRN: [id number] DOB: October 22, 1945 Today's Date: 01/24/2019    History of Present Illness 73 yo male s/p L THA posterior approach on 01/19/19. PMH includes anxiety, AVN bilateral hips, COPD, diverticulitis, HTN, OSA, schizophrenia.    PT Comments    Pt progressing toward PT goals. Demonstrating some carryover with following THP during functional mobility although still requires occasional cuing, especially with sit<>stand transfers. Pt is still awaiting SNF placement.   Follow Up Recommendations  Follow surgeon's recommendation for DC plan and follow-up therapies;Supervision for mobility/OOB     Equipment Recommendations  None recommended by PT    Recommendations for Other Services       Precautions / Restrictions Precautions Precautions: Fall;Posterior Hip Precaution Comments: reviewed posterior hip precautions  Restrictions Other Position/Activity Restrictions: WBAT    Mobility  Bed Mobility                  Transfers Overall transfer level: Needs assistance Equipment used: Rolling walker (2 wheeled) Transfers: Sit to/from Stand Sit to Stand: Min assist;Min guard         General transfer comment: light assist to rise and transition to RW. cues for hand placement and THP  Ambulation/Gait Ambulation/Gait assistance: Min guard;Supervision Gait Distance (Feet): 80 Feet Assistive device: Rolling walker (2 wheeled) Gait Pattern/deviations: Step-to pattern;Decreased stance time - left;Decreased weight shift to left;Antalgic;Decreased step length - right;Decreased step length - left Gait velocity: decr   General Gait Details: Min guard for safety. Verbal cuing for sequencing, step length. trunk extension    Stairs             Wheelchair Mobility    Modified Rankin (Stroke Patients Only)       Balance           Standing balance support: Bilateral upper extremity supported Standing  balance-Leahy Scale: Fair Standing balance comment: reliant on external support in standing for dynamic activity                            Cognition Arousal/Alertness: Awake/alert Behavior During Therapy: WFL for tasks assessed/performed;Flat affect Overall Cognitive Status: Within Functional Limits for tasks assessed                                        Exercises Total Joint Exercises Ankle Circles/Pumps: AROM;Both;10 reps    General Comments        Pertinent Vitals/Pain Pain Assessment: 0-10 Pain Score: 3  Pain Location: L hip Pain Descriptors / Indicators: Sore Pain Intervention(s): Limited activity within patient's tolerance;Monitored during session    Home Living                      Prior Function            PT Goals (current goals can now be found in the care plan section) Acute Rehab PT Goals Patient Stated Goal: walk better PT Goal Formulation: With patient Time For Goal Achievement: 01/26/19 Potential to Achieve Goals: Good Progress towards PT goals: Progressing toward goals    Frequency    7X/week      PT Plan Current plan remains appropriate    Co-evaluation              AM-PAC PT "6 Clicks" Mobility   Outcome Measure  Help needed  turning from your back to your side while in a flat bed without using bedrails?: A Little Help needed moving from lying on your back to sitting on the side of a flat bed without using bedrails?: A Little Help needed moving to and from a bed to a chair (including a wheelchair)?: A Little Help needed standing up from a chair using your arms (e.g., wheelchair or bedside chair)?: A Little Help needed to walk in hospital room?: A Little Help needed climbing 3-5 steps with a railing? : A Lot 6 Click Score: 17    End of Session Equipment Utilized During Treatment: Gait belt Activity Tolerance: Patient tolerated treatment well;Patient limited by fatigue Patient left: in  chair;with call bell/phone within reach;with chair alarm set   PT Visit Diagnosis: Other abnormalities of gait and mobility (R26.89);Difficulty in walking, not elsewhere classified (R26.2)     Time: [id number] PT Time Calculation (min) (ACUTE ONLY): 19 min  Charges:  $Gait Training: 8-22 mins                     Kenneth Watts, PT  Pager: [phone] Acute Rehab Dept [facility]): [phone]   01/24/2019    [facility] 01/24/2019, 5:20 PM

## 2019-01-25 NOTE — Progress Notes (Signed)
Physical Therapy Treatment Patient Details Name: Kenneth Watts MRN: [id number] DOB: February 27, 1946 Today's Date: 01/25/2019    History of Present Illness 73 yo male s/p L THA posterior approach on 01/19/19. PMH includes anxiety, AVN bilateral hips, COPD, diverticulitis, HTN, OSA, schizophrenia.    PT Comments    Pt  Progressing toward goals. Requiring incr assist with bed mobility today,  Still  Awaiting SNF placement   Follow Up Recommendations  Follow surgeon's recommendation for DC plan and follow-up therapies;Supervision for mobility/OOB     Equipment Recommendations  None recommended by PT    Recommendations for Other Services       Precautions / Restrictions Precautions Precautions: Fall;Posterior Hip Precaution Comments: reviewed posterior hip precautions  Restrictions Other Position/Activity Restrictions: WBAT    Mobility  Bed Mobility Overal bed mobility: Needs Assistance Bed Mobility: Supine to Sit     Supine to sit: Mod assist     General bed mobility comments: assist with bil LEs, cues for THP and technique  Transfers Overall transfer level: Needs assistance Equipment used: Rolling walker (2 wheeled) Transfers: Sit to/from Stand Sit to Stand: Min assist;Min guard         General transfer comment: light assist to rise and transition to RW. cues for hand placement and THP  Ambulation/Gait Ambulation/Gait assistance: Min guard;Supervision Gait Distance (Feet): 10 Feet Assistive device: Rolling walker (2 wheeled) Gait Pattern/deviations: Step-to pattern;Decreased stance time - left;Decreased weight shift to left;Antalgic;Decreased step length - right;Decreased step length - left Gait velocity: decr   General Gait Details: Min guard for safety. Verbal cuing for sequencing, step length. trunk extension    Stairs             Wheelchair Mobility    Modified Rankin (Stroke Patients Only)       Balance           Standing balance support:  Bilateral upper extremity supported Standing balance-Leahy Scale: Fair Standing balance comment: reliant on external support in standing for dynamic activity                            Cognition Arousal/Alertness: Awake/alert Behavior During Therapy: WFL for tasks assessed/performed;Flat affect Overall Cognitive Status: Within Functional Limits for tasks assessed                                        Exercises Total Joint Exercises Ankle Circles/Pumps: AROM;Both;10 reps Long Arc Quad: AROM;Strengthening;Left;10 reps;Seated    General Comments        Pertinent Vitals/Pain Pain Assessment: 0-10 Pain Score: 1  Pain Location: L hip Pain Descriptors / Indicators: Sore Pain Intervention(s): Limited activity within patient's tolerance;Monitored during session    Home Living                      Prior Function            PT Goals (current goals can now be found in the care plan section) Acute Rehab PT Goals Patient Stated Goal: walk better PT Goal Formulation: With patient Time For Goal Achievement: 01/26/19 Potential to Achieve Goals: Good Progress towards PT goals: Progressing toward goals    Frequency    7X/week      PT Plan Current plan remains appropriate    Co-evaluation  AM-PAC PT "6 Clicks" Mobility   Outcome Measure  Help needed turning from your back to your side while in a flat bed without using bedrails?: A Little Help needed moving from lying on your back to sitting on the side of a flat bed without using bedrails?: A Little Help needed moving to and from a bed to a chair (including a wheelchair)?: A Little Help needed standing up from a chair using your arms (e.g., wheelchair or bedside chair)?: A Little Help needed to walk in hospital room?: A Little Help needed climbing 3-5 steps with a railing? : A Lot 6 Click Score: 17    End of Session Equipment Utilized During Treatment: Gait  belt Activity Tolerance: Patient tolerated treatment well Patient left: in chair;with call bell/phone within reach;with chair alarm set Nurse Communication: Mobility status PT Visit Diagnosis: Other abnormalities of gait and mobility (R26.89);Difficulty in walking, not elsewhere classified (R26.2)     Time: [id number] PT Time Calculation (min) (ACUTE ONLY): 21 min  Charges:  $Therapeutic Activity: 8-22 mins                     [name], PT  Pager: [phone] Acute Rehab Dept [facility]): [phone]   01/25/2019    [facility] 01/25/2019, 11:58 AM

## 2019-01-25 NOTE — Progress Notes (Signed)
Physical Therapy Treatment Patient Details Name: Kenneth Watts MRN: [id number] DOB: 1946/02/05 Today's Date: 01/25/2019    History of Present Illness 73 yo male s/p L THA posterior approach on 01/19/19. PMH includes anxiety, AVN bilateral hips, COPD, diverticulitis, HTN, OSA, schizophrenia.    PT Comments    Pt is pleasant and cooperative. Continue to recommend SNF post acute to maximize independence and overall safety/prevent falls. Continue PT in acute setting   Follow Up Recommendations  Follow surgeon's recommendation for DC plan and follow-up therapies;Supervision for mobility/OOB     Equipment Recommendations  None recommended by PT    Recommendations for Other Services       Precautions / Restrictions Precautions Precautions: Fall;Posterior Hip Precaution Comments: reviewed posterior hip precautions  Restrictions Other Position/Activity Restrictions: WBAT    Mobility  Bed Mobility Overal bed mobility: Needs Assistance Bed Mobility: Sit to Supine       Sit to supine: Min assist;Mod assist   General bed mobility comments: assist with bil LEs, cues for THP and technique  Transfers Overall transfer level: Needs assistance Equipment used: Rolling walker (2 wheeled) Transfers: Sit to/from Stand Sit to Stand: Min guard;Supervision         General transfer comment: cues for hand placement and THP, tactile cues to maintain neutral hip  Ambulation/Gait Ambulation/Gait assistance: Min guard;Supervision Gait Distance (Feet): 60 Feet Assistive device: Rolling walker (2 wheeled) Gait Pattern/deviations: Step-to pattern;Decreased stance time - left;Decreased weight shift to left;Antalgic;Decreased step length - right;Decreased step length - left Gait velocity: decr   General Gait Details:  Verbal cuing for sequencing, step length. trunk extension    Stairs             Wheelchair Mobility    Modified Rankin (Stroke Patients Only)       Balance           Standing balance support: Bilateral upper extremity supported Standing balance-Leahy Scale: Fair Standing balance comment: reliant on external support in standing for dynamic activity                            Cognition Arousal/Alertness: Awake/alert Behavior During Therapy: WFL for tasks assessed/performed;Flat affect Overall Cognitive Status: Within Functional Limits for tasks assessed                                        Exercises Total Joint Exercises Ankle Circles/Pumps: AROM;Both;10 reps Quad Sets: AROM;Both;10 reps    General Comments        Pertinent Vitals/Pain Pain Assessment: 0-10 Pain Score: 2  Pain Location: L hip Pain Descriptors / Indicators: Sore Pain Intervention(s): Limited activity within patient's tolerance;Monitored during session;Premedicated before session    Home Living                      Prior Function            PT Goals (current goals can now be found in the care plan section) Acute Rehab PT Goals Patient Stated Goal: walk better PT Goal Formulation: With patient Time For Goal Achievement: 01/26/19 Potential to Achieve Goals: Good Progress towards PT goals: Progressing toward goals    Frequency    7X/week      PT Plan Current plan remains appropriate    Co-evaluation  AM-PAC PT "6 Clicks" Mobility   Outcome Measure  Help needed turning from your back to your side while in a flat bed without using bedrails?: A Little Help needed moving from lying on your back to sitting on the side of a flat bed without using bedrails?: A Little Help needed moving to and from a bed to a chair (including a wheelchair)?: A Little Help needed standing up from a chair using your arms (e.g., wheelchair or bedside chair)?: A Little Help needed to walk in hospital room?: A Little Help needed climbing 3-5 steps with a railing? : A Lot 6 Click Score: 17    End of Session Equipment  Utilized During Treatment: Gait belt Activity Tolerance: Patient tolerated treatment well Patient left: in chair;with call bell/phone within reach;with chair alarm set Nurse Communication: Mobility status PT Visit Diagnosis: Other abnormalities of gait and mobility (R26.89);Difficulty in walking, not elsewhere classified (R26.2)     Time: [id number] PT Time Calculation (min) (ACUTE ONLY): 19 min  Charges:                        [name], PT  Pager: [phone] Acute Rehab Dept [facility]): [phone]   01/25/2019    [facility] 01/25/2019, 3:02 PM

## 2019-01-26 NOTE — Progress Notes (Signed)
     Subjective:  Patient reports pain as mild.  No complaints.  Still moving slowly with PT.    Objective:   VITALS:   Vitals:   01/25/19 0438 01/25/19 1408 01/25/19 2019 01/26/19 0552  BP: 136/64 129/64 133/81 (!) 151/84  Pulse: 65 69 73 66  Resp: 16 16 18 18   Temp: 97.9 F (36.6 C) 98.2 F (36.8 C) 98.9 F (37.2 C) 98.7 F (37.1 C)  TempSrc: Oral Oral Oral Oral  SpO2: 100% 97% 96% 99%  Weight:      Height:        Neurologically intact Dorsiflexion/Plantar flexion intact Incision: no drainage   Lab Results  Component Value Date   WBC 8.0 01/22/2019   HGB 11.4 (L) 01/22/2019   HCT 36.2 (L) 01/22/2019   MCV 96.0 01/22/2019   PLT 163 01/22/2019   BMET    Component Value Date/Time   NA 141 01/21/2019 0256   K 4.3 01/21/2019 0256   CL 109 01/21/2019 0256   CO2 26 01/21/2019 0256   GLUCOSE 123 (H) 01/21/2019 0256   BUN 16 01/21/2019 0256   CREATININE 0.89 01/21/2019 0256   CREATININE 1.27 08/06/2013 1431   CALCIUM 8.6 (L) 01/21/2019 0256   GFRNONAA >60 01/21/2019 0256   GFRAA >60 01/21/2019 0256     Assessment/Plan: 7 Days Post-Op   Principal Problem:   Primary localized osteoarthritis of left hip   Discharge to SNF Awaiting PASRR.  Anticipated LOS equal to or greater than 2 midnights due to - Age 81 and older with one or more of the following:  - Obesity  - Expected need for hospital services (PT, OT, Nursing) required for safe  discharge  - Anticipated need for postoperative skilled nursing care or inpatient rehab  - Active co-morbidities: Anemia OR   - Unanticipated findings during/Post Surgery: Slow post-op progression: GI, pain control, mobility  - Patient is a high risk of re-admission due to: Barriers to post-acute care (logistical, no family support in home)   Will update dc summary just in case he can go today.  Kenneth Watts 01/26/2019, 10:10 AM   Marchia Bond, MD Cell [phone]

## 2019-01-26 NOTE — Progress Notes (Signed)
Physical Therapy Treatment Patient Details Name: Kenneth Watts MRN: [id number] DOB: 1945-10-27 Today's Date: 01/26/2019    History of Present Illness 73 yo male s/p L THA posterior approach on 01/19/19. PMH includes anxiety, AVN bilateral hips, COPD, diverticulitis, HTN, OSA, schizophrenia.    PT Comments    Pt progressing toward PT goals, will benefit from SNF post acute.  Pt with improving carryover with THP, requiring only verbal cues (multi-modal cues). Pt is cooperative with PT  Follow Up Recommendations  Follow surgeon's recommendation for DC plan and follow-up therapies;Supervision for mobility/OOB     Equipment Recommendations  None recommended by PT    Recommendations for Other Services       Precautions / Restrictions Precautions Precautions: Fall;Posterior Hip Precaution Comments: reviewed posterior hip precautions  Restrictions Other Position/Activity Restrictions: WBAT    Mobility  Bed Mobility               General bed mobility comments: OOB with NT  Transfers Overall transfer level: Needs assistance Equipment used: Rolling walker (2 wheeled) Transfers: Sit to/from Stand Sit to Stand: Min guard;Supervision         General transfer comment:  verbal cues for hand placement and THP, avoid internal rotation with sit<>stand (improved from yesterday)  Ambulation/Gait Ambulation/Gait assistance: Supervision;Min guard Gait Distance (Feet): 55 Feet(15' ) Assistive device: Rolling walker (2 wheeled) Gait Pattern/deviations: Step-to pattern;Decreased stance time - left;Decreased weight shift to left;Antalgic;Decreased step length - right;Decreased step length - left Gait velocity: decr   General Gait Details:  Verbal cuing for sequencing, step length. trunk extension; pt tends to require cues for incr step length with incr distance (begins to shuffle)   Stairs             Wheelchair Mobility    Modified Rankin (Stroke Patients Only)        Balance     Sitting balance-Leahy Scale: Good     Standing balance support: Bilateral upper extremity supported Standing balance-Leahy Scale: Fair Standing balance comment: reliant on external support in standing for dynamic activity                            Cognition Arousal/Alertness: Awake/alert Behavior During Therapy: WFL for tasks assessed/performed;Flat affect Overall Cognitive Status: Within Functional Limits for tasks assessed                                        Exercises Total Joint Exercises Ankle Circles/Pumps: AROM;Both;10 reps Quad Sets: AROM;Both;10 reps    General Comments        Pertinent Vitals/Pain Pain Assessment: 0-10 Pain Score: 2  Pain Location: L hip Pain Descriptors / Indicators: Sore Pain Intervention(s): Limited activity within patient's tolerance;Monitored during session;Premedicated before session    Home Living                      Prior Function            PT Goals (current goals can now be found in the care plan section) Acute Rehab PT Goals Patient Stated Goal: walk better PT Goal Formulation: With patient Time For Goal Achievement: 01/26/19 Potential to Achieve Goals: Good Progress towards PT goals: Progressing toward goals    Frequency    7X/week      PT Plan Current plan remains appropriate    Co-evaluation  AM-PAC PT "6 Clicks" Mobility   Outcome Measure  Help needed turning from your back to your side while in a flat bed without using bedrails?: A Little Help needed moving from lying on your back to sitting on the side of a flat bed without using bedrails?: A Little Help needed moving to and from a bed to a chair (including a wheelchair)?: A Little Help needed standing up from a chair using your arms (e.g., wheelchair or bedside chair)?: A Little Help needed to walk in hospital room?: A Little Help needed climbing 3-5 steps with a railing? : A Lot 6  Click Score: 17    End of Session Equipment Utilized During Treatment: Gait belt Activity Tolerance: Patient tolerated treatment well Patient left: in chair;with call bell/phone within reach;with chair alarm set Nurse Communication: Mobility status PT Visit Diagnosis: Other abnormalities of gait and mobility (R26.89);Difficulty in walking, not elsewhere classified (R26.2)     Time: [id number] PT Time Calculation (min) (ACUTE ONLY): 23 min  Charges:  $Gait Training: 23-37 mins                     [name], PT  Pager: [phone] Acute Rehab Dept [facility]): [phone]   01/26/2019    [facility] 01/26/2019, 2:29 PM

## 2019-01-26 NOTE — Care Management Important Message (Signed)
Important Message  Patient Details  Name: MELIO WASIL MRN: [id number] Date of Birth: 1946-02-11   Medicare Important Message Given:  Yes. CMA printed out IM for Case Management Nurse or CSW to give to patient.      Aalaiyah Yassin 01/26/2019, 10:41 AM

## 2019-01-27 LAB — NOVEL CORONAVIRUS, NAA (HOSP ORDER, SEND-OUT TO REF LAB; TAT 18-24 HRS): SARS-CoV-2, NAA: NOT DETECTED

## 2019-01-27 NOTE — TOC Transition Note (Signed)
Transition of Care [facility]) - CM/SW Discharge Note   Patient Details  Name: Kenneth Watts MRN: [id number] Date of Birth: [birth year]  Transition of Care [facility]) CM/SW Contact:  Lia Hopping, [place] Phone Number: 01/27/2019, 10:15 AM   Clinical Narrative:    Nurse call report to:[phone] Room 7013 Friend to transport.    Final next level of care: [place] Barriers to Discharge:Barriers Resolved  Patient Goals and CMS Choice Patient states their goals for this hospitalization and ongoing recovery are:: to go home CMS Medicare.gov Compare Post Acute Care list provided to:: (Arranged with the patient in Orthopedic Office) Choice offered to / list presented to : Patient  Discharge Placement PASRR number recieved: 01/26/19            Patient chooses bed at: [facility] Patient to be transferred to facility by: Friend Name of family member notified: Friend Patient and family notified of of transfer: 01/27/19  Discharge Plan and Services In-house Referral: Clinical Social Work Discharge Planning Services: CM Consult                                 Social Determinants of Health ([place]) Interventions     Readmission Risk Interventions No flowsheet data found.

## 2019-01-27 NOTE — Progress Notes (Signed)
PT Cancellation Note  Patient Details Name: Kenneth Watts MRN: [id number] DOB: 02-19-1946   Cancelled Treatment:    Reason Eval/Treat Not Completed: Patient declined, no reason specified. Pt politely declined this am, stating he was leaving in couple hrs and wanted to stay in bed for lunch   [facility] 01/27/2019, 11:52 AM

## 2019-01-27 NOTE — TOC Progression Note (Signed)
Transition of Care [facility]) - Progression Note    Patient Details  Name: Kenneth Watts MRN: [id number] Date of Birth: 08-27-1945  Transition of Care [facility]) CM/SW [place], LCSW Phone Number: 01/27/2019, 8:45 AM  Clinical Narrative:    PASRR received. COVID-19 test pending placement.  Patient aware.   Expected Discharge Plan: Skilled Nursing Facility Barriers to Discharge: COVID-19 test  Expected Discharge Plan and Services Expected Discharge Plan: [place] In-house Referral: Clinical Social Work Discharge Planning Services: CM Consult   Living arrangements for the past 2 months: Single Family Home Expected Discharge Date: 01/26/19                                     Social Determinants of Health (SDOH) Interventions    Readmission Risk Interventions No flowsheet data found.

## 2019-01-27 NOTE — Progress Notes (Signed)
Hip dressing changed to aquacel dressing. IV removed. Patient dressed.   Report given to nurse at [facility].  Patient waiting for pick up.

## 2019-01-27 NOTE — Plan of Care (Signed)
°  Problem: Pain Management: °Goal: Pain level will decrease with appropriate interventions °Outcome: Progressing °  °Problem: Safety: °Goal: Ability to remain free from injury will improve °Outcome: Progressing °  °

## 2019-11-29 ENCOUNTER — Ambulatory Visit: Payer: Medicare HMO | Attending: Internal Medicine

## 2019-11-29 DIAGNOSIS — Z20822 Contact with and (suspected) exposure to covid-19: Secondary | ICD-10-CM

## 2019-11-30 ENCOUNTER — Telehealth: Payer: Self-pay | Admitting: Family Medicine

## 2019-11-30 LAB — NOVEL CORONAVIRUS, NAA: SARS-CoV-2, NAA: NOT DETECTED

## 2019-11-30 LAB — SARS-COV-2, NAA 2 DAY TAT

## 2019-11-30 NOTE — Telephone Encounter (Signed)
Negative COVID results given. Patient results "NOT Detected." Caller expressed understanding. ° °

## 2020-12-20 ENCOUNTER — Other Ambulatory Visit: Payer: Self-pay

## 2020-12-20 DIAGNOSIS — I872 Venous insufficiency (chronic) (peripheral): Secondary | ICD-10-CM

## 2020-12-29 ENCOUNTER — Encounter (HOSPITAL_COMMUNITY): Payer: Medicare HMO

## 2021-01-09 ENCOUNTER — Ambulatory Visit (HOSPITAL_COMMUNITY)
Admission: RE | Admit: 2021-01-09 | Discharge: 2021-01-09 | Disposition: A | Discharge status: Home / Self Care | Payer: Medicare HMO | Source: Ambulatory Visit | Attending: Vascular Surgery | Admitting: Vascular Surgery

## 2021-01-09 ENCOUNTER — Other Ambulatory Visit: Payer: Self-pay

## 2021-01-09 ENCOUNTER — Ambulatory Visit: Payer: Medicare HMO | Admitting: Physician Assistant

## 2021-01-09 VITALS — BP 137/80 | HR 61 | Temp 98.3°F | Resp 20 | Ht 72.0 in | Wt 266.1 lb

## 2021-01-09 DIAGNOSIS — I872 Venous insufficiency (chronic) (peripheral): Secondary | ICD-10-CM | POA: Diagnosis not present

## 2021-01-09 DIAGNOSIS — M7989 Other specified soft tissue disorders: Secondary | ICD-10-CM

## 2021-01-09 NOTE — Progress Notes (Signed)
[facility]           [facility] [place]  History and Physical   Kenneth Watts is a 75 y.o. male who presents with BLE leg swelling.  He states that his legs have been swelling for years.  His left leg is worse than the right.  He has hx of left hip replacement a few years ago.  He has never had a DVT and there is no family hx of varicose veins.  He has not worn compression and he does not elevate his legs.   He works as a Agricultural engineer.  He does exercise on a stationary bike for 45 minutes 3x/week as well as weights 6 days a week.    He has hx of COPD.  The pt is not on a statin for cholesterol management.  The pt is not on a daily aspirin.   Other AC:  none The pt is not on medication for hypertension.   The pt is not diabetic.   Tobacco hx:  former  There is not family hx of AAA.   Past Medical History:  Diagnosis Date   Anxiety    Avascular necrosis of bones of both hips (HCC)    COPD (chronic obstructive pulmonary disease) (HCC)    no inhalers used   Diverticulitis    no recent problems   Hypertension    Melanoma ([place]) 2014   left arm   OSA (obstructive sleep apnea)    Oxygen dependent    3 liters with cpap set on 5 at hs   Primary localized osteoarthritis of left hip 01/19/2019   Schizophrenia ([place])    "paranoid schizopherneic with serois anger issues, 3 personalities, multiple psych issues"    Past Surgical History:  Procedure Laterality Date   CHOLECYSTECTOMY     HERNIA REPAIR     chest   MELANOMA EXCISION Left 10/01/12   arm   TOTAL HIP ARTHROPLASTY Left 01/19/2019   Procedure: TOTAL HIP ARTHROPLASTY;  Surgeon: Marchia Bond, MD;  Location: [facility];  Service: Orthopedics;  Laterality: Left;    Social History   Socioeconomic History   Marital status: Single    Spouse name: Not on file   Number of children: Not on file   Years of education: Not on file   Highest education level: Not on file  Occupational  History   Not on file  Tobacco Use   Smoking status: Former    Packs/day: 1.00    Years: 19.00    Pack years: 19.00    Types: Cigarettes    Quit date: 05/20/1984    Years since quitting: 36.6   Smokeless tobacco: Never  Vaping Use   Vaping Use: Never used  Substance and Sexual Activity   Alcohol use: No   Drug use: Never   Sexual activity: Not on file  Other Topics Concern   Not on file  Social History Narrative   Works midnight shift as a Presenter, broadcasting   1 son in [place]   1 son in [place]- lives with a significant other   Enjoys working on Teaching laboratory technician, goes to [employer]   Girlfriend has a Architectural technologist in counselor/education    Has worked in the past as a Community education officer in the past.              Social Determinants of Health  Financial Resource Strain: Not on file  Food Insecurity: Not on file  Transportation Needs: Not on file  Physical Activity: Not on file  Stress: Not on file  Social Connections: Not on file  Intimate Partner Violence: Not on file    Family History  Problem Relation Age of Onset   Alcohol abuse Mother    Hypertension Father    Cancer Paternal Uncle    Diabetes Neg Hx    Heart disease Neg Hx     Current Outpatient Medications  Medication Sig Dispense Refill   aspirin EC 325 MG tablet Take 1 tablet (325 mg total) by mouth 2 (two) times daily. 60 tablet 0   baclofen (LIORESAL) 10 MG tablet Take 1 tablet (10 mg total) by mouth 3 (three) times daily. As needed for muscle spasm 50 tablet 0   clomiPHENE (CLOMID) 50 MG tablet Take 25 mg by mouth every evening.      HYDROcodone-acetaminophen (NORCO) 10-325 MG tablet Take 1 tablet by mouth every 6 (six) hours as needed. 28 tablet 0   levocetirizine (XYZAL) 5 MG tablet Take 5 mg by mouth every evening.     losartan-hydrochlorothiazide (HYZAAR) 50-12.5 MG tablet Take 1 tablet by mouth daily. 90 tablet 1   LUTEIN PO Take 1 capsule by mouth daily.     Multiple Vitamin  (MULTIVITAMIN) tablet Take 1 tablet by mouth daily.      ondansetron (ZOFRAN) 4 MG tablet Take 1 tablet (4 mg total) by mouth every 8 (eight) hours as needed for nausea or vomiting. 10 tablet 0   QUEtiapine (SEROQUEL) 50 MG tablet Take 50 mg by mouth at bedtime.      Resveratrol 100 MG CAPS Take 100 mg by mouth daily.     sennosides-docusate sodium (SENOKOT-S) 8.6-50 MG tablet Take 2 tablets by mouth daily. 30 tablet 1   ziprasidone (GEODON) 60 MG capsule Take 60 mg by mouth every evening.      ziprasidone (GEODON) 80 MG capsule Take 80 mg by mouth every evening.      No current facility-administered medications for this visit.    Allergies  Allergen Reactions   Lisinopril Other (See Comments)    Cough    REVIEW OF SYSTEMS:   '[X]'$  denotes positive finding, '[ ]'$  denotes negative finding Cardiac  Comments:  Chest pain or chest pressure:    Shortness of breath upon exertion:    Short of breath when lying flat:    Irregular heart rhythm:        Vascular    Pain in calf, thigh, or hip brought on by ambulation:    Pain in feet at night that wakes you up from your sleep:     Blood clot in your veins:    Leg swelling:  x       Pulmonary    Oxygen at home:    Productive cough:     Wheezing:         Neurologic    Sudden weakness in arms or legs:     Sudden numbness in arms or legs:     Sudden onset of difficulty speaking or slurred speech:    Temporary loss of vision in one eye:     Problems with dizziness:         Gastrointestinal    Blood in stool:     Vomited blood:         Genitourinary    Burning when urinating:     Blood in  urine:        Psychiatric    Major depression:         Hematologic    Bleeding problems:    Problems with blood clotting too easily:        Skin    Rashes or ulcers:        Constitutional    Fever or chills:      PHYSICAL EXAMINATION:  Today's Vitals   01/09/21 1107  BP: 137/80  Pulse: 61  Resp: 20  Temp: 98.3 F (36.8 C)   TempSrc: Temporal  SpO2: 95%  Weight: 266 lb 1.6 oz (120.7 kg)  Height: 6' (1.829 m)   Body mass index is 36.09 kg/m.   General:  WDWN in NAD; vital signs documented above Gait: Not observed HENT: WNL, normocephalic Pulmonary: normal non-labored breathing without wheezing Cardiac: regular HR; without carotid bruits Abdomen: soft, NT, no masses; aortic pulse is not palpable Skin: without rashes Vascular Exam/Pulses:  Right Left  Radial 2+ (normal) 2+ (normal)  DP 2+ (normal) 2+ (normal)  PT Unable to palpate Unable to palpate   Extremities: BLE swelling with left slightly worse than right  Neurologic: A&O X 3;  moving all extremities equally Psychiatric:  The pt has Normal affect.   Non-Invasive Vascular Imaging:   Venous duplex on 01/09/2021: Venous Reflux Times  +--------------+---------+------+-----------+------------+--------+  RIGHT         Reflux NoRefluxReflux TimeDiameter cmsComments                          Yes                                   +--------------+---------+------+-----------+------------+--------+  CFV                     yes   >1 second                       +--------------+---------+------+-----------+------------+--------+  FV mid        no                                              +--------------+---------+------+-----------+------------+--------+  Popliteal     no                                              +--------------+---------+------+-----------+------------+--------+  GSV at SFJ              yes    >500 ms      0.52              +--------------+---------+------+-----------+------------+--------+  GSV prox thigh          yes    >500 ms      0.49              +--------------+---------+------+-----------+------------+--------+  GSV mid thigh           yes    >500 ms      0.38              +--------------+---------+------+-----------+------------+--------+  GSV dist thighno  0.49              +--------------+---------+------+-----------+------------+--------+  GSV at knee   no                            0.41              +--------------+---------+------+-----------+------------+--------+  GSV prox calf no                            0.35              +--------------+---------+------+-----------+------------+--------+  SSV Pop Fossa no                            0.33              +--------------+---------+------+-----------+------------+--------+  SSV prox calf no                            0.4               +--------------+---------+------+-----------+------------+--------+  SSV mid calf  no                            0.33              +--------------+---------+------+-----------+------------+--------+      +--------------+---------+------+-----------+------------+--------+  LEFT          Reflux NoRefluxReflux TimeDiameter cmsComments                          Yes                                   +--------------+---------+------+-----------+------------+--------+  CFV           no                                              +--------------+---------+------+-----------+------------+--------+  FV mid        no                                              +--------------+---------+------+-----------+------------+--------+  Popliteal     no                                              +--------------+---------+------+-----------+------------+--------+  GSV at [facility]    no                            0.82              +--------------+---------+------+-----------+------------+--------+  GSV prox thighno                            0.68              +--------------+---------+------+-----------+------------+--------+  GSV mid thigh no                            0.64              +--------------+---------+------+-----------+------------+--------+  GSV  dist thighno                            0.58              +--------------+---------+------+-----------+------------+--------+  GSV at knee             yes    >500 ms      0.67              +--------------+---------+------+-----------+------------+--------+  GSV prox calf           yes    >500 ms      0.43              +--------------+---------+------+-----------+------------+--------+  SSV Pop Fossa no                            0.27              +--------------+---------+------+-----------+------------+--------+  SSV prox calf no                            0.28              +--------------+---------+------+-----------+------------+--------+  SSV mid calf  no                            0.26              +--------------+---------+------+-----------+------------+--------+   Summary:  Right:  - No evidence of deep vein thrombosis seen in the right lower extremity,  from the common femoral through the popliteal veins.  - No evidence of superficial venous thrombosis in the right lower  extremity.  - Venous reflux is noted in the right common femoral vein.  - Venous reflux is noted in the right sapheno-femoral junction.  - Venous reflux is noted in the right greater saphenous vein in the thigh.     Left:  - No evidence of deep vein thrombosis seen in the left lower extremity,  from the common femoral through the popliteal veins.  - No evidence of superficial venous thrombosis in the left lower  extremity.  - Venous reflux is noted in the left greater saphenous vein in the calf.     Kenneth Watts is a 75 y.o. male who presents with: BLE swelling with left worse than right  -pt has palpable pedal pulses bilaterally -pt does not have evidence of DVT.  Pt does have venous reflux:  on the right at the [facility] as well as the proximal and mid thigh.  The vein measures .4cm to .5cm.  He also has some reflux on the left but only in the GSV ant the knee and  proximal calf.  -discussed with pt about wearing thigh high 20-30 mmHg compression stockings  -pt currently does not elevate his legs-discussed the importance of leg elevation and how to elevate properly - pt is advised to elevate their legs and a diagram is given to them to demonstrate to lay flat on their back with knees elevated and slightly bent with their  feet higher than her knees, which puts their feet higher than their heart for 15 minutes per day.  If they cannot lay flat, advised to lay as flat as possible.  -pt is advised to continue as much walking as possible and avoid sitting or standing for long periods of time.  -discussed importance of weight loss and exercise and that water aerobics would also be beneficial.  -handout with recommendations given -pt will f/u 3 months

## 2021-03-24 IMAGING — DX DG HIP (WITH OR WITHOUT PELVIS) 1V PORT*L*
6 series · 6 of 6 positions shown · non-contrast
Comparison: None.

CLINICAL DATA: Left total hip arthroplasty.

EXAM:
PORTABLE PELVIS 1-2 VIEWS; DG HIP (WITH OR WITHOUT PELVIS) 1V PORT
LEFT

[pelvis ap (1 of 2)]
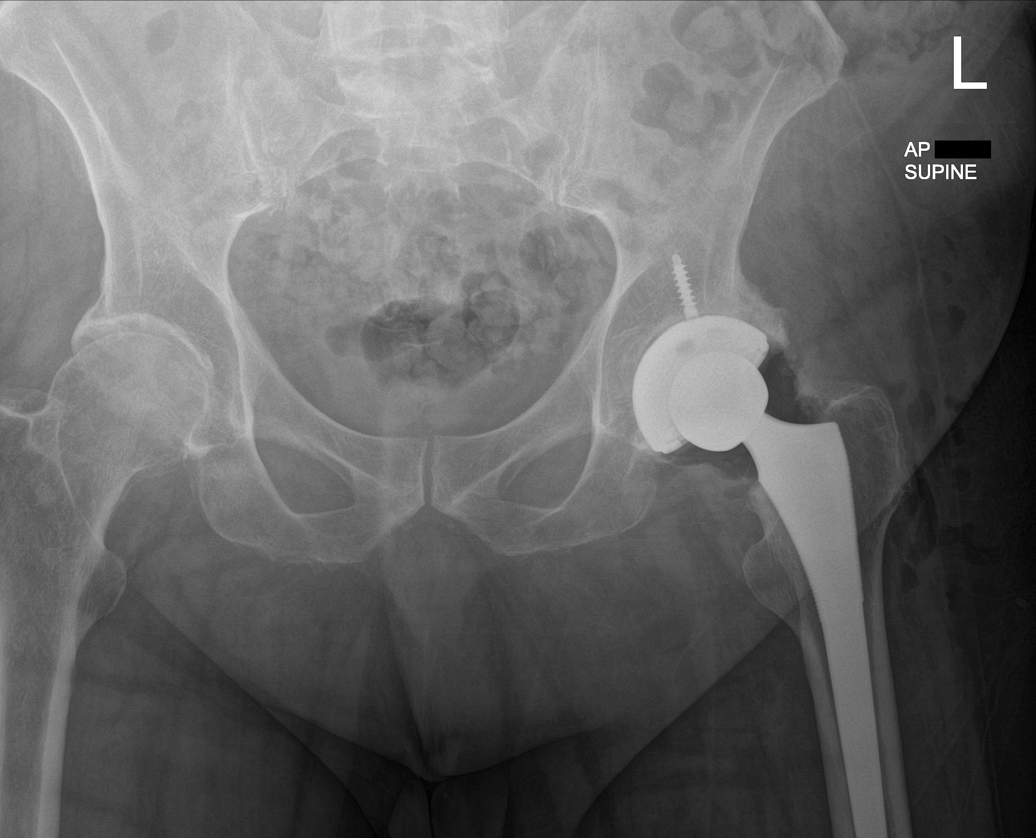

[pelvis ap (2 of 2)]
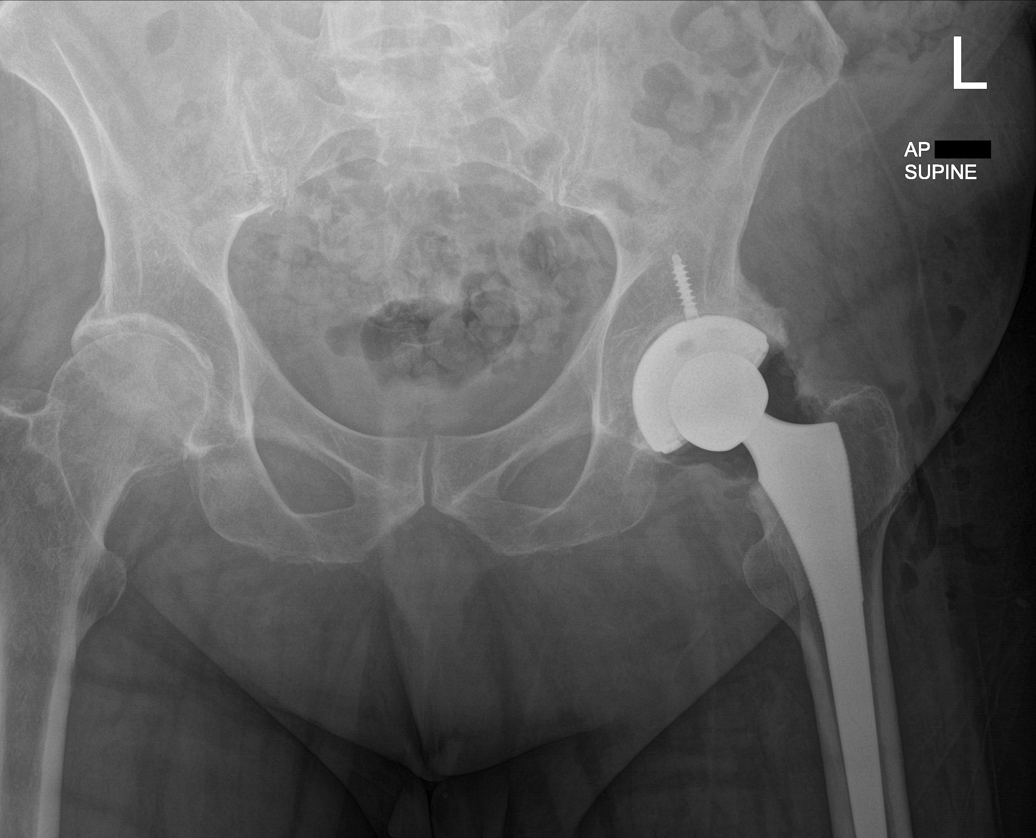

[hip ap (1 of 2)]
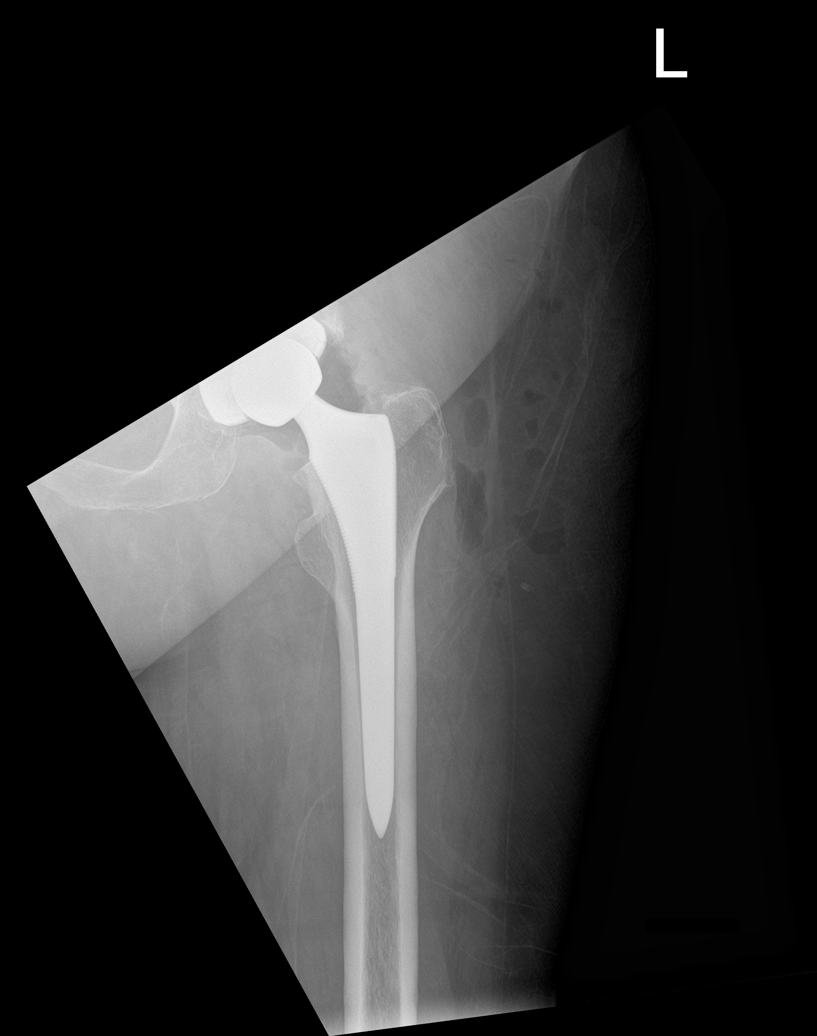

[hip ap (2 of 2)]
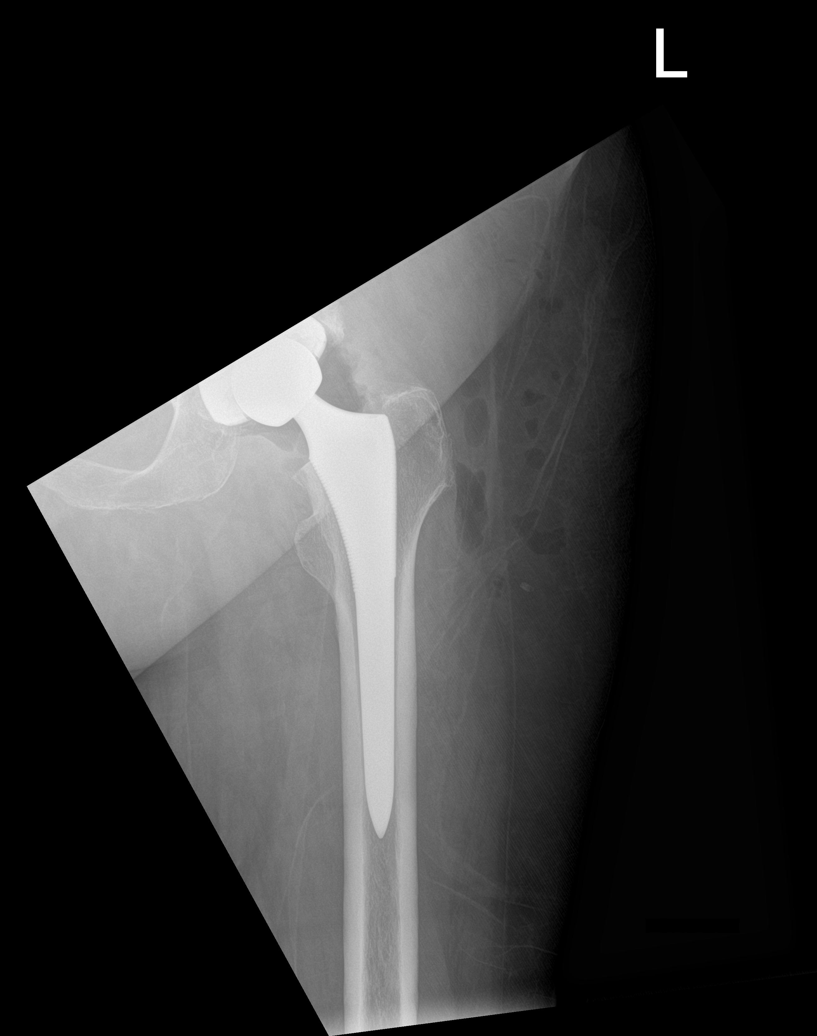

[hip frog leg (1 of 2)]
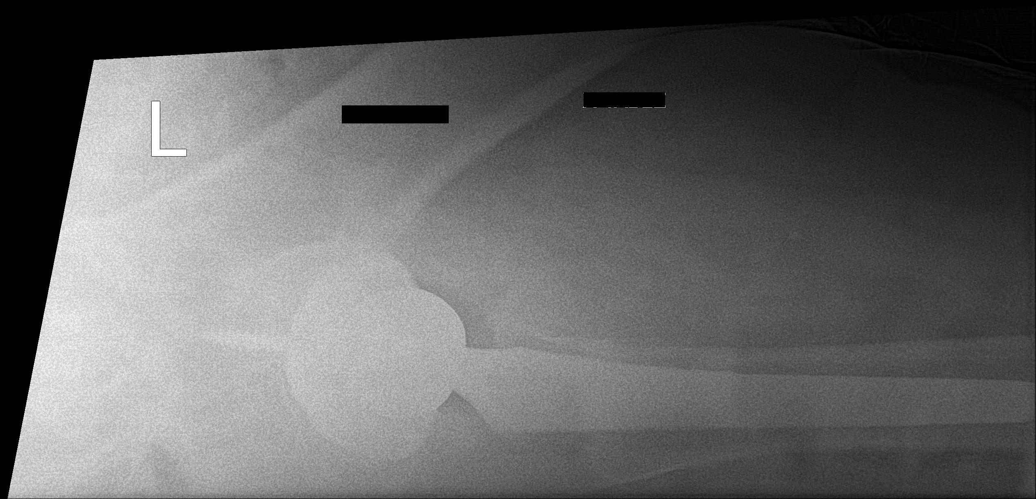

[hip frog leg (2 of 2)]
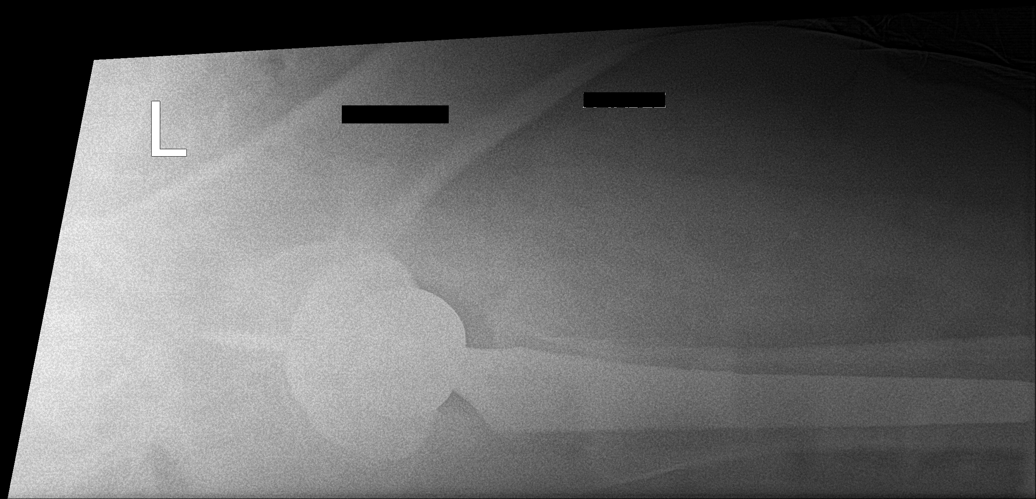

[6 of 6 positions shown; findings below may reference images not displayed]

FINDINGS: The left hip demonstrates a total arthroplasty without evidence of
hardware failure or complication. There is expected intra-articular
air. There is no fracture or dislocation. The alignment is anatomic.
Post-surgical changes noted in the surrounding soft tissues.
IMPRESSION: 1. Left total hip arthroplasty without acute postoperative
complication.

## 2021-04-18 ENCOUNTER — Ambulatory Visit: Payer: Medicare HMO | Admitting: Vascular Surgery

## 2022-07-31 ENCOUNTER — Emergency Department (HOSPITAL_COMMUNITY): Payer: Medicare Other

## 2022-07-31 ENCOUNTER — Other Ambulatory Visit: Payer: Self-pay

## 2022-07-31 ENCOUNTER — Encounter (HOSPITAL_COMMUNITY): Payer: Self-pay

## 2022-07-31 ENCOUNTER — Inpatient Hospital Stay (HOSPITAL_COMMUNITY)
Admission: EM | Admit: 2022-07-31 | Discharge: 2022-08-07 | DRG: 551 | Disposition: A | Discharge status: Skilled Nursing Facility | Payer: Medicare Other | Attending: Internal Medicine | Admitting: Internal Medicine

## 2022-07-31 ENCOUNTER — Encounter: Payer: Self-pay | Admitting: Orthopedic Surgery

## 2022-07-31 ENCOUNTER — Other Ambulatory Visit (HOSPITAL_COMMUNITY): Payer: Self-pay | Admitting: Orthopedic Surgery

## 2022-07-31 DIAGNOSIS — S064XAA Epidural hemorrhage with loss of consciousness status unknown, initial encounter: Secondary | ICD-10-CM

## 2022-07-31 DIAGNOSIS — Z96642 Presence of left artificial hip joint: Secondary | ICD-10-CM | POA: Diagnosis present

## 2022-07-31 DIAGNOSIS — M109 Gout, unspecified: Secondary | ICD-10-CM | POA: Diagnosis present

## 2022-07-31 DIAGNOSIS — S22078A Other fracture of T9-T10 vertebra, initial encounter for closed fracture: Secondary | ICD-10-CM | POA: Diagnosis present

## 2022-07-31 DIAGNOSIS — Z9981 Dependence on supplemental oxygen: Secondary | ICD-10-CM | POA: Diagnosis not present

## 2022-07-31 DIAGNOSIS — Z6833 Body mass index (BMI) 33.0-33.9, adult: Secondary | ICD-10-CM | POA: Diagnosis not present

## 2022-07-31 DIAGNOSIS — F209 Schizophrenia, unspecified: Secondary | ICD-10-CM | POA: Diagnosis not present

## 2022-07-31 DIAGNOSIS — S22000S Wedge compression fracture of unspecified thoracic vertebra, sequela: Secondary | ICD-10-CM

## 2022-07-31 DIAGNOSIS — Z87891 Personal history of nicotine dependence: Secondary | ICD-10-CM

## 2022-07-31 DIAGNOSIS — Z888 Allergy status to other drugs, medicaments and biological substances status: Secondary | ICD-10-CM | POA: Diagnosis not present

## 2022-07-31 DIAGNOSIS — S82831A Other fracture of upper and lower end of right fibula, initial encounter for closed fracture: Secondary | ICD-10-CM | POA: Diagnosis present

## 2022-07-31 DIAGNOSIS — I10 Essential (primary) hypertension: Secondary | ICD-10-CM | POA: Diagnosis not present

## 2022-07-31 DIAGNOSIS — G4733 Obstructive sleep apnea (adult) (pediatric): Secondary | ICD-10-CM

## 2022-07-31 DIAGNOSIS — J449 Chronic obstructive pulmonary disease, unspecified: Secondary | ICD-10-CM | POA: Diagnosis present

## 2022-07-31 DIAGNOSIS — F200 Paranoid schizophrenia: Secondary | ICD-10-CM | POA: Diagnosis present

## 2022-07-31 DIAGNOSIS — F419 Anxiety disorder, unspecified: Secondary | ICD-10-CM | POA: Diagnosis present

## 2022-07-31 DIAGNOSIS — Z85820 Personal history of malignant melanoma of skin: Secondary | ICD-10-CM

## 2022-07-31 DIAGNOSIS — E669 Obesity, unspecified: Secondary | ICD-10-CM | POA: Diagnosis present

## 2022-07-31 DIAGNOSIS — S22079A Unspecified fracture of T9-T10 vertebra, initial encounter for closed fracture: Secondary | ICD-10-CM

## 2022-07-31 DIAGNOSIS — Z8249 Family history of ischemic heart disease and other diseases of the circulatory system: Secondary | ICD-10-CM | POA: Diagnosis not present

## 2022-07-31 DIAGNOSIS — Z79899 Other long term (current) drug therapy: Secondary | ICD-10-CM | POA: Diagnosis not present

## 2022-07-31 DIAGNOSIS — R296 Repeated falls: Secondary | ICD-10-CM | POA: Diagnosis present

## 2022-07-31 DIAGNOSIS — E785 Hyperlipidemia, unspecified: Secondary | ICD-10-CM | POA: Diagnosis present

## 2022-07-31 DIAGNOSIS — I700 Atherosclerosis of aorta: Secondary | ICD-10-CM | POA: Diagnosis present

## 2022-07-31 DIAGNOSIS — S22009A Unspecified fracture of unspecified thoracic vertebra, initial encounter for closed fracture: Secondary | ICD-10-CM | POA: Diagnosis present

## 2022-07-31 DIAGNOSIS — Y929 Unspecified place or not applicable: Secondary | ICD-10-CM | POA: Diagnosis not present

## 2022-07-31 DIAGNOSIS — W19XXXA Unspecified fall, initial encounter: Principal | ICD-10-CM

## 2022-07-31 DIAGNOSIS — W1830XA Fall on same level, unspecified, initial encounter: Secondary | ICD-10-CM | POA: Diagnosis present

## 2022-07-31 DIAGNOSIS — S32000A Wedge compression fracture of unspecified lumbar vertebra, initial encounter for closed fracture: Secondary | ICD-10-CM

## 2022-07-31 LAB — BASIC METABOLIC PANEL
Anion gap: 11 (ref 5–15)
BUN: 14 mg/dL (ref 8–23)
CO2: 24 mmol/L (ref 22–32)
Calcium: 8.7 mg/dL — ABNORMAL LOW (ref 8.9–10.3)
Chloride: 105 mmol/L (ref 98–111)
Creatinine, Ser: 0.92 mg/dL (ref 0.61–1.24)
GFR, Estimated: >60 mL/min (ref >60)
Glucose, Bld: 109 mg/dL — ABNORMAL HIGH (ref 70–99)
Potassium: 3.7 mmol/L (ref 3.5–5.1)
Sodium: 140 mmol/L (ref 135–145)

## 2022-07-31 LAB — CBC WITH DIFFERENTIAL/PLATELET
Abs Immature Granulocytes: 0.02 10*3/uL (ref 0.00–0.07)
Basophils Absolute: 0 10*3/uL (ref 0.0–0.1)
Basophils Relative: 1 %
Eosinophils Absolute: 0.1 10*3/uL (ref 0.0–0.5)
Eosinophils Relative: 1 %
HCT: 43.9 % (ref 39.0–52.0)
Hemoglobin: 13.3 g/dL (ref 13.0–17.0)
Immature Granulocytes: 0 %
Lymphocytes Relative: 19 %
Lymphs Abs: 1.2 10*3/uL (ref 0.7–4.0)
MCH: 27.1 pg (ref 26.0–34.0)
MCHC: 30.3 g/dL (ref 30.0–36.0)
MCV: 89.6 fL (ref 80.0–100.0)
Monocytes Absolute: 0.5 10*3/uL (ref 0.1–1.0)
Monocytes Relative: 8 %
Neutro Abs: 4.5 10*3/uL (ref 1.7–7.7)
Neutrophils Relative %: 71 %
Platelets: 207 10*3/uL (ref 150–400)
RBC: 4.9 MIL/uL (ref 4.22–5.81)
RDW: 19.1 % — ABNORMAL HIGH (ref 11.5–15.5)
WBC: 6.3 10*3/uL (ref 4.0–10.5)
nRBC: 0 % (ref 0.0–0.2)

## 2022-07-31 MED ORDER — METHOCARBAMOL 1000 MG/10ML IJ SOLN
500.0000 mg | Freq: Once | INTRAMUSCULAR | Status: DC
  Filled 2022-07-31: qty 5

## 2022-07-31 MED ORDER — OXYCODONE HCL 5 MG PO TABS
10.0000 mg | ORAL_TABLET | Freq: Four times a day (QID) | ORAL | Status: DC | PRN
  Administered 2022-07-31 – 2022-08-07 (×12): 10 mg via ORAL
  Filled 2022-07-31 (×12): qty 2

## 2022-07-31 MED ORDER — ACETAMINOPHEN 325 MG PO TABS: 650.0000 mg | ORAL_TABLET | Freq: Four times a day (QID) | ORAL | Status: DC | PRN

## 2022-07-31 MED ORDER — OXYCODONE HCL 5 MG PO TABS
5.0000 mg | ORAL_TABLET | ORAL | Status: AC
  Administered 2022-07-31: 5 mg via ORAL
  Filled 2022-07-31: qty 1

## 2022-07-31 MED ORDER — METHOCARBAMOL 1000 MG/10ML IJ SOLN
500.0000 mg | Freq: Once | INTRAVENOUS | Status: DC
  Filled 2022-07-31: qty 5

## 2022-07-31 MED ORDER — SODIUM CHLORIDE 0.9% FLUSH
3.0000 mL | Freq: Two times a day (BID) | INTRAVENOUS | Status: DC
  Administered 2022-07-31 – 2022-08-07 (×14): 3 mL via INTRAVENOUS

## 2022-07-31 MED ORDER — KETOROLAC TROMETHAMINE 15 MG/ML IJ SOLN
15.0000 mg | Freq: Once | INTRAMUSCULAR | Status: AC
  Administered 2022-07-31: 15 mg via INTRAVENOUS
  Filled 2022-07-31: qty 1

## 2022-07-31 MED ORDER — ACETAMINOPHEN 500 MG PO TABS
1000.0000 mg | ORAL_TABLET | Freq: Once | ORAL | Status: AC
  Administered 2022-07-31: 1000 mg via ORAL
  Filled 2022-07-31: qty 2

## 2022-07-31 MED ORDER — HYDROMORPHONE HCL 1 MG/ML IJ SOLN
0.5000 mg | INTRAMUSCULAR | Status: DC | PRN
  Administered 2022-08-01 – 2022-08-06 (×9): 1 mg via INTRAVENOUS
  Administered 2022-08-06: 0.5 mg via INTRAVENOUS
  Administered 2022-08-06 – 2022-08-07 (×4): 1 mg via INTRAVENOUS
  Filled 2022-07-31 (×16): qty 1

## 2022-07-31 MED ORDER — ZIPRASIDONE HCL 40 MG PO CAPS
60.0000 mg | ORAL_CAPSULE | Freq: Every evening | ORAL | Status: DC
  Administered 2022-07-31 – 2022-08-06 (×7): 60 mg via ORAL
  Filled 2022-07-31 (×8): qty 1

## 2022-07-31 MED ORDER — ACETAMINOPHEN 650 MG RE SUPP: 650.0000 mg | Freq: Four times a day (QID) | RECTAL | Status: DC | PRN

## 2022-07-31 MED ORDER — HYDROCODONE-ACETAMINOPHEN 5-325 MG PO TABS
1.0000 | ORAL_TABLET | Freq: Four times a day (QID) | ORAL | Status: DC | PRN
  Administered 2022-08-01: 2 via ORAL
  Filled 2022-07-31: qty 2

## 2022-07-31 MED ORDER — FENTANYL CITRATE PF 50 MCG/ML IJ SOSY
100.0000 ug | PREFILLED_SYRINGE | Freq: Once | INTRAMUSCULAR | Status: AC
  Administered 2022-07-31: 100 ug via INTRAVENOUS
  Filled 2022-07-31: qty 2

## 2022-07-31 MED ORDER — METHOCARBAMOL 1000 MG/10ML IJ SOLN
500.0000 mg | Freq: Four times a day (QID) | INTRAVENOUS | Status: DC | PRN
  Administered 2022-07-31: 500 mg via INTRAVENOUS
  Filled 2022-07-31: qty 500

## 2022-07-31 MED ORDER — SORBITOL 70 % SOLN: 30.0000 mL | Freq: Every day | Status: DC | PRN

## 2022-07-31 MED ORDER — ZIPRASIDONE HCL 40 MG PO CAPS
80.0000 mg | ORAL_CAPSULE | Freq: Every evening | ORAL | Status: DC
  Administered 2022-07-31 – 2022-08-06 (×7): 80 mg via ORAL
  Filled 2022-07-31 (×7): qty 2
  Filled 2022-07-31: qty 1
  Filled 2022-07-31: qty 2

## 2022-07-31 MED ORDER — POLYETHYLENE GLYCOL 3350 17 G PO PACK: 17.0000 g | PACK | Freq: Every day | ORAL | Status: DC | PRN

## 2022-07-31 NOTE — ED Provider Triage Note (Signed)
Emergency Medicine Provider Triage Evaluation Note  Kenneth Watts , a 77 y.o. male  was evaluated in triage.  Pt complains of 2 mechanical falls over the past 11 days.  First fall happened when he said his legs gave out from underneath him and he fell landing hitting his back.  He was seen by the orthopedic provider who did a x-ray who reports that he likely has compression fractures although they cannot see it on x-ray.  He has since then been having worsening pain.  Patient reports that he was sent over here for an MRI of his back and of his knee.  I asked patient him an MRI instead of CT and if he does go to the ER the patient reports yes.  Review of Systems  Positive:  Negative:   Physical Exam  BP (!) 148/75 (BP Location: Left Arm)   Pulse 67   Temp 98 F (36.7 C)   Resp 16   Ht 6' (1.829 m)   Wt 113.4 kg   SpO2 99%   BMI 33.91 kg/m  Gen:   Awake, no distress   Resp:  Normal effort  MSK:   Moves extremities without difficulty  Other:  Diffuse lower thoracic/upper lumbar tenderness palpation.  Abdomen is soft and nontender.  He does have some swelling and diffuse tenderness to the right knee.  Medical Decision Making  Medically screening exam initiated at 3:29 PM.  Appropriate orders placed.  Kenneth Watts was informed that the remainder of the evaluation will be completed by another provider, this initial triage assessment does not replace that evaluation, and the importance of remaining in the ED until their evaluation is complete.  I cannot see any previous notes from Kenneth Watts.  Contacted Kenneth Watts without success.  Will order CTs to start off.  Patient does not have any neuro deficit.   Sherrell Puller, [place] 07/31/22 1530

## 2022-07-31 NOTE — ED Triage Notes (Signed)
Pt bib ems from home; mechanical fall on 3/2; diagnosed with possible compression fractures; fell again yesterday, was not evaluated; c/o R knee tenderness, no obvious deformity; no loc, did not hit head, no thinners; 50 mcg fentanyl given pta, 20 ga rac; 157/74, HR 58, 98% RA; pain 2/10

## 2022-07-31 NOTE — H&P (Signed)
History and Physical   Kenneth Watts [id number] DOB: [birth year] DOA: 07/31/2022  PCP: [name], MD   Patient coming from: Home  Chief Complaint: Falls, back pain  HPI: Kenneth Watts is a 77 y.o. male with medical history significant of OSA, hypertension, hyperlipidemia, gout, dyspepsia, schizophrenia, anxiety, COPD, diverticulosis, hypertension presenting after recent falls with severe back pain.  Patient reports has had  2 falls in the past week.  For time he fell his legs gave out while he was walking, most recent fall was yesterday.  He saw orthopedics a few days ago and x-rays were done showing some compression fractures.  Most recent fall yesterday has caused severe pain and difficulty walking, this fall occurred when he was walking up a ramp and try to catch something that he was dropping and he spun and fell.  He denies fevers, chills, chest pain, shortness of breath, abdominal pain, constipation, diarrhea, nausea, vomiting.    ED Course: Vital signs in the ED significant for blood pressure in the 0000000 to [id number] systolic.  Lab workup included BMP with glucose 109 and calcium 8.7.  CBC within normal limits.  Imaging studies included right knee x-ray which showed a acute fracture of the fibular diaphysis.  CT of the T and L-spine showing acute fracture through the T9-T10 disc base, questionable epidural hematoma with moderate mass effect and changes that could be consistent with underlying ankylosing spondylitis.  No acute fracture of the lumbar spine noted.  MRI of the T-spine and L-spine is pending.  Patient received Tylenol, Toradol, fentanyl, oxycodone, Robaxin in the ED.  Ortho surgery consulted and stated it was beyond their scope due to no dedicated spine specialist.  Neurosurgery consulted and recommended MRI, pain control, PT OT and will reevaluate following results of MRI.  Review of Systems: As per HPI otherwise all other systems reviewed and are negative.  Past  Medical History:  Diagnosis Date   Anxiety    Avascular necrosis of bones of both hips (HCC)    COPD (chronic obstructive pulmonary disease) (HCC)    no inhalers used   Diverticulitis    no recent problems   Hypertension    Melanoma ([place]) 2014   left arm   OSA (obstructive sleep apnea)    Oxygen dependent    3 liters with cpap set on 5 at hs   Primary localized osteoarthritis of left hip 01/19/2019   Schizophrenia ([place])    "paranoid schizopherneic with serois anger issues, 3 personalities, multiple psych issues"    Past Surgical History:  Procedure Laterality Date   CHOLECYSTECTOMY     HERNIA REPAIR     chest   MELANOMA EXCISION Left 10/01/12   arm   TOTAL HIP ARTHROPLASTY Left 01/19/2019   Procedure: TOTAL HIP ARTHROPLASTY;  Surgeon: Marchia Bond, MD;  Location: [facility];  Service: Orthopedics;  Laterality: Left;    Social History  reports that he quit smoking about 38 years ago. He has a 19.00 pack-year smoking history. He has never used smokeless tobacco. He reports that he does not drink alcohol and does not use drugs.  Allergies  Allergen Reactions   Lisinopril Other (See Comments)    Cough    Family History  Problem Relation Age of Onset   Alcohol abuse Mother    Hypertension Father    Cancer Paternal Uncle    Diabetes Neg Hx    Heart disease Neg Hx   Reviewed on admission  Prior to Admission medications  Medication Sig Start Date End Date Taking? Authorizing Provider  ASHWAGANDHA PO Take by mouth.    [provider]  clomiPHENE (CLOMID) 50 MG tablet Take 25 mg by mouth every evening.  08/03/18   [provider]  Coenzyme Q10-Vitamin E 100-300 MG-UNIT WAFR Take by mouth.    [provider]  Doxepin HCl 6 MG TABS Take 1 tablet by mouth at bedtime. 01/08/21   [provider]  levocetirizine (XYZAL) 5 MG tablet Take 5 mg by mouth every evening.    [provider]  LUTEIN PO Take 1 capsule by mouth daily.    [provider]  Multiple Vitamins-Minerals (MULTIVITAMIN & MINERAL) LIQD Take by mouth.    [provider]  olive oil external oil Take 2 capsules by mouth daily.    [provider]  OXYGEN Inhale into the lungs.    [provider]  Resveratrol 100 MG CAPS Take by mouth.    [provider]  sennosides-docusate sodium (SENOKOT-S) 8.6-50 MG tablet Take 2 tablets by mouth daily. 01/19/19   Marchia Bond, MD  ziprasidone (GEODON) 60 MG capsule Take 60 mg by mouth every evening.     [provider]  ziprasidone (GEODON) 80 MG capsule Take 80 mg by mouth every evening.     [provider]    Physical Exam: Vitals:   07/31/22 1645 07/31/22 1700 07/31/22 1857 07/31/22 1900  BP: (!) 169/82 (!) 171/85  (!) 146/64  Pulse: (!) 59 70  74  Resp: '14 14  16  '$ Temp:   97.9 F (36.6 C)   TempSrc:   Oral   SpO2: 95% 96%  96%  Weight:      Height:        Physical Exam Constitutional:      General: He is not in acute distress.    Appearance: Normal appearance.  HENT:     Head: Normocephalic and atraumatic.     Mouth/Throat:     Mouth: Mucous membranes are moist.     Pharynx: Oropharynx is clear.  Eyes:     Extraocular Movements: Extraocular movements intact.     Pupils: Pupils are equal, round, and reactive to light.  Cardiovascular:     Rate and Rhythm: Normal rate and regular rhythm.     Pulses: Normal pulses.     Heart sounds: Normal heart sounds.  Pulmonary:     Effort: Pulmonary effort is normal. No respiratory distress.     Breath sounds: Normal breath sounds.  Abdominal:     General: Bowel sounds are normal. There is no distension.     Palpations: Abdomen is soft.     Tenderness: There is no abdominal tenderness.  Musculoskeletal:        General: No swelling or deformity.     Right lower leg: Edema present.     Left lower leg: Edema present.     Comments: Pain with significant movement. Bilat LE neurovascularly intact. Exam  limited by pain.  Skin:    General: Skin is warm and dry.  Neurological:     General: No focal deficit present.     Mental Status: Mental status is at baseline.    Labs on Admission: I have personally reviewed following labs and imaging studies  CBC: Recent Labs  Lab 07/31/22 1630  WBC 6.3  NEUTROABS 4.5  HGB 13.3  HCT 43.9  MCV 89.6  PLT [id number]    Basic Metabolic Panel: Recent Labs  Lab  07/31/22 1630  NA 140  K 3.7  CL 105  CO2 24  GLUCOSE 109*  BUN 14  CREATININE 0.92  CALCIUM 8.7*    GFR: Estimated Creatinine Clearance: 87.4 mL/min (by C-G formula based on SCr of 0.92 mg/dL).  Liver Function Tests: No results for input(s): "AST", "ALT", "ALKPHOS", "BILITOT", "PROT", "ALBUMIN" in the last 168 hours.  Urine analysis:    Component Value Date/Time   COLORURINE YELLOW 12/01/2014 0926   APPEARANCEUR CLEAR 12/01/2014 0926   LABSPEC >=1.030 (A) 12/01/2014 0926   PHURINE 5.5 12/01/2014 0926   GLUCOSEU NEGATIVE 12/01/2014 0926   HGBUR NEGATIVE 12/01/2014 0926   BILIRUBINUR NEGATIVE 12/01/2014 0926   KETONESUR NEGATIVE 12/01/2014 0926   UROBILINOGEN 0.2 12/01/2014 0926   NITRITE NEGATIVE 12/01/2014 0926   LEUKOCYTESUR NEGATIVE 12/01/2014 0926    Radiological Exams on Admission: MR THORACIC SPINE WO CONTRAST  Addendum Date: 07/31/2022   ADDENDUM REPORT: 07/31/2022 19:29 ADDENDUM: Critical Value/emergent results were called by telephone at the time of interpretation on 07/31/2022 at 7:25 pm to provider Dr. Trilby Drummer, who verbally acknowledged these results. Electronically Signed   By: Emmit Alexanders M.D.   On: 07/31/2022 19:29   Result Date: 07/31/2022 CLINICAL DATA:  Mid and low back pain, neuro deficit.  Trauma. EXAM: MRI THORACIC AND LUMBAR SPINE WITHOUT CONTRAST TECHNIQUE: Multiplanar and multiecho pulse sequences of the thoracic and lumbar spine were obtained without intravenous contrast. COMPARISON:  CT thoracic and lumbar spine 07/31/2022. FINDINGS: MRI THORACIC  SPINE FINDINGS Alignment:  Normal. Vertebrae: Redemonstrated fracture through the T9-10 disc space with extension into the posterior elements at T9. Associated mild marrow edema in the posterior aspect of the T10 superior endplate without extension into the posterior elements. Cord: Dorsal epidural hematoma centered at T8-9, extending from T7 to T10, with compression of the spinal cord at the T10-11 level. No definite spinal cord edema. Paraspinal and other soft tissues: Mild bilateral paravertebral edema and hematoma centered around the T9-10 level. Redemonstration of small bilateral pleural effusions. Disc levels: No significant degenerative change. MRI LUMBAR SPINE FINDINGS Segmentation:  Standard. Alignment:  Physiologic. Vertebrae:  No fracture, evidence of discitis, or bone lesion. Conus medullaris and cauda equina: Conus extends to the L1 level. Conus and cauda equina appear normal. Paraspinal and other soft tissues: Unremarkable. Disc levels: Mild lower lumbar facet arthropathy without significant spinal canal stenosis or neural foraminal narrowing. IMPRESSION: 1. Confirmed dorsal epidural hematoma centered at T8-9, extending from T7 to T10, with compression of the spinal cord at the T10-11 level. No definite spinal cord edema. 2. Redemonstrated fracture through the T9-10 disc space with extension into the posterior elements at T9. Associated mild marrow edema in the posterior aspect of the T10 superior endplate without extension into the posterior elements. Radiology assistant personnel have been notified to put me in telephone contact with the referring physician or the referring physician's clinical representative in order to discuss these findings. Once this communication is established I will issue an addendum to this report for documentation purposes. Electronically Signed: By: Emmit Alexanders M.D. On: 07/31/2022 19:15   MR LUMBAR SPINE WO CONTRAST  Addendum Date: 07/31/2022   ADDENDUM REPORT:  07/31/2022 19:29 ADDENDUM: Critical Value/emergent results were called by telephone at the time of interpretation on 07/31/2022 at 7:25 pm to provider Dr. Trilby Drummer, who verbally acknowledged these results. Electronically Signed   By: Emmit Alexanders M.D.   On: 07/31/2022 19:29   Result Date: 07/31/2022 CLINICAL DATA:  Mid and low back pain,  neuro deficit.  Trauma. EXAM: MRI THORACIC AND LUMBAR SPINE WITHOUT CONTRAST TECHNIQUE: Multiplanar and multiecho pulse sequences of the thoracic and lumbar spine were obtained without intravenous contrast. COMPARISON:  CT thoracic and lumbar spine 07/31/2022. FINDINGS: MRI THORACIC SPINE FINDINGS Alignment:  Normal. Vertebrae: Redemonstrated fracture through the T9-10 disc space with extension into the posterior elements at T9. Associated mild marrow edema in the posterior aspect of the T10 superior endplate without extension into the posterior elements. Cord: Dorsal epidural hematoma centered at T8-9, extending from T7 to T10, with compression of the spinal cord at the T10-11 level. No definite spinal cord edema. Paraspinal and other soft tissues: Mild bilateral paravertebral edema and hematoma centered around the T9-10 level. Redemonstration of small bilateral pleural effusions. Disc levels: No significant degenerative change. MRI LUMBAR SPINE FINDINGS Segmentation:  Standard. Alignment:  Physiologic. Vertebrae:  No fracture, evidence of discitis, or bone lesion. Conus medullaris and cauda equina: Conus extends to the L1 level. Conus and cauda equina appear normal. Paraspinal and other soft tissues: Unremarkable. Disc levels: Mild lower lumbar facet arthropathy without significant spinal canal stenosis or neural foraminal narrowing. IMPRESSION: 1. Confirmed dorsal epidural hematoma centered at T8-9, extending from T7 to T10, with compression of the spinal cord at the T10-11 level. No definite spinal cord edema. 2. Redemonstrated fracture through the T9-10 disc space with  extension into the posterior elements at T9. Associated mild marrow edema in the posterior aspect of the T10 superior endplate without extension into the posterior elements. Radiology assistant personnel have been notified to put me in telephone contact with the referring physician or the referring physician's clinical representative in order to discuss these findings. Once this communication is established I will issue an addendum to this report for documentation purposes. Electronically Signed: By: Emmit Alexanders M.D. On: 07/31/2022 19:15   CT Lumbar Spine Wo Contrast  Result Date: 07/31/2022 CLINICAL DATA:  Back trauma.  Pain. EXAM: CT THORACIC AND LUMBAR SPINE WITHOUT CONTRAST TECHNIQUE: Multidetector CT imaging of the thoracic and lumbar spine was performed without contrast. Multiplanar CT image reconstructions were also generated. RADIATION DOSE REDUCTION: This exam was performed according to the departmental dose-optimization program which includes automated exposure control, adjustment of the mA and/or kV according to patient size and/or use of iterative reconstruction technique. COMPARISON:  None Available. FINDINGS: CT THORACIC SPINE FINDINGS Alignment: Normal. Vertebrae: Contiguous anterior syndesmophytes and ossification of the supraspinous ligament, suggestive of ankylosing spondylitis. Fracture through the T9-10 disc space extending into the posterior aspect of the inferior endplate of T9, through the posterior elements of T9 bilaterally (sagittal images 24 and 34 series 9). Findings suggestive of possible epidural hematoma in the dorsal soft tissues at the level of the fracture with moderate mass effect on the thecal sac (axial image 104 series 4). Paraspinal and other soft tissues: Dilated, fluid-filled esophagus. Small bilateral pleural effusions. Disc levels: No significant degenerative change. CT LUMBAR SPINE FINDINGS Segmentation: Conventional numbering is assumed with 5 non-rib-bearing,  lumbar type vertebral bodies. Alignment: Normal. Vertebrae: Contiguous anterior syndesmophytes continue through the L2 level with ossification of the supraspinous ligament continuing through the L1 level. No acute fracture of the lumbar spine. Paraspinal and other soft tissues: Atherosclerotic calcifications of the abdominal aorta and its branches. Disc levels: No significant degenerative change. IMPRESSION: 1. Acute fracture through the T9-10 disc space extending into the posterior aspect of the inferior endplate of T9, through the posterior elements of T9 bilaterally. 2. Findings suggestive of possible epidural hematoma in the  dorsal soft tissues at the level of the fracture with moderate mass effect on the thecal sac. 3. Contiguous anterior syndesmophytes and ossification of the supraspinous ligament, suggestive of underlying ankylosing spondylitis. 4. No acute fracture of the lumbar spine. 5. Small bilateral pleural effusions. 6. Dilated, fluid-filled esophagus. 7. Aortic Atherosclerosis (ICD10-I70.0). Critical Value/emergent results were called by telephone at the time of interpretation on 07/31/2022 at 3:50 pm to provider RILEY RANSOM , who verbally acknowledged these results. Electronically Signed   By: Emmit Alexanders M.D.   On: 07/31/2022 15:56   CT Thoracic Spine Wo Contrast  Result Date: 07/31/2022 CLINICAL DATA:  Back trauma.  Pain. EXAM: CT THORACIC AND LUMBAR SPINE WITHOUT CONTRAST TECHNIQUE: Multidetector CT imaging of the thoracic and lumbar spine was performed without contrast. Multiplanar CT image reconstructions were also generated. RADIATION DOSE REDUCTION: This exam was performed according to the departmental dose-optimization program which includes automated exposure control, adjustment of the mA and/or kV according to patient size and/or use of iterative reconstruction technique. COMPARISON:  None Available. FINDINGS: CT THORACIC SPINE FINDINGS Alignment: Normal. Vertebrae: Contiguous  anterior syndesmophytes and ossification of the supraspinous ligament, suggestive of ankylosing spondylitis. Fracture through the T9-10 disc space extending into the posterior aspect of the inferior endplate of T9, through the posterior elements of T9 bilaterally (sagittal images 24 and 34 series 9). Findings suggestive of possible epidural hematoma in the dorsal soft tissues at the level of the fracture with moderate mass effect on the thecal sac (axial image 104 series 4). Paraspinal and other soft tissues: Dilated, fluid-filled esophagus. Small bilateral pleural effusions. Disc levels: No significant degenerative change. CT LUMBAR SPINE FINDINGS Segmentation: Conventional numbering is assumed with 5 non-rib-bearing, lumbar type vertebral bodies. Alignment: Normal. Vertebrae: Contiguous anterior syndesmophytes continue through the L2 level with ossification of the supraspinous ligament continuing through the L1 level. No acute fracture of the lumbar spine. Paraspinal and other soft tissues: Atherosclerotic calcifications of the abdominal aorta and its branches. Disc levels: No significant degenerative change. IMPRESSION: 1. Acute fracture through the T9-10 disc space extending into the posterior aspect of the inferior endplate of T9, through the posterior elements of T9 bilaterally. 2. Findings suggestive of possible epidural hematoma in the dorsal soft tissues at the level of the fracture with moderate mass effect on the thecal sac. 3. Contiguous anterior syndesmophytes and ossification of the supraspinous ligament, suggestive of underlying ankylosing spondylitis. 4. No acute fracture of the lumbar spine. 5. Small bilateral pleural effusions. 6. Dilated, fluid-filled esophagus. 7. Aortic Atherosclerosis (ICD10-I70.0). Critical Value/emergent results were called by telephone at the time of interpretation on 07/31/2022 at 3:50 pm to provider RILEY RANSOM , who verbally acknowledged these results. Electronically  Signed   By: Emmit Alexanders M.D.   On: 07/31/2022 15:56   DG Knee Complete 4 Views Right  Result Date: 07/31/2022 CLINICAL DATA:  Fall EXAM: RIGHT KNEE - COMPLETE 4+ VIEW COMPARISON:  None Available. FINDINGS: There is an acute comminuted nondisplaced fracture of the proximal fibular diaphysis. There is no dislocation. There is overlying soft tissue swelling. No significant joint effusion. IMPRESSION: Acute comminuted nondisplaced fracture of the proximal fibular diaphysis. Electronically Signed   By: Ronney Asters M.D.   On: 07/31/2022 15:24    EKG: Not performed in the emergency department  Assessment/Plan Principal Problem:   Thoracic spine fracture [facility]) Active Problems:   Schizophrenia (HCC)   OSA (obstructive sleep apnea)   Gout   HTN (hypertension)   Hyperlipidemia   Falls Thoracic  spine fracture Epidural hematoma > Patient presenting with several recent falls with the most recent one being yesterday resulting in severe pain and difficulty ambulating. > CT imaging with concern for fracture through the T9-T10 disc space and possible epidural hematoma. > Neurosurgery consulted and are seeing the patient with recommendation for MRI of the T and L-spine, they will then follow-up further after this.  In the meantime recommending PT, OT, pain control. - Monitor on telemetry - Appreciate neurosurgery recommendations - Continue as needed pain control with Tylenol for mild pain, hydrocodone for moderate to severe pain, Dilaudid for breakthrough pain. - Continue as needed IV Robaxin - Follow-up MRI ADDENDUM > MRI completed.  Notified neurosurgery.  At this time no plan for surgical intervention based on these results.  Schizophrenia Anxiety - Continue home Geodon  OSA - Continue home CPAP  DVT prophylaxis: SCDs for now Code Status:   Full  Family Communication:  None on admission  Disposition Plan:   Patient is from:  Home  Anticipated DC to:  Home  Anticipated DC date:  2  to 4 days  Anticipated DC barriers: Pending PT evaluation, if placement is recommended  Consults called:  Neurosurgery Admission status:  Inpatient, telemetry  Severity of Illness: The appropriate patient status for this patient is INPATIENT. Inpatient status is judged to be reasonable and necessary in order to provide the required intensity of service to ensure the patient's safety. The patient's presenting symptoms, physical exam findings, and initial radiographic and laboratory data in the context of their chronic comorbidities is felt to place them at high risk for further clinical deterioration. Furthermore, it is not anticipated that the patient will be medically stable for discharge from the hospital within 2 midnights of admission.   * I certify that at the point of admission it is my clinical judgment that the patient will require inpatient hospital care spanning beyond 2 midnights from the point of admission due to high intensity of service, high risk for further deterioration and high frequency of surveillance required.Marcelyn Bruins MD [facility]  How to contact the [facility] Attending or Consulting provider [place] or covering provider during after hours [place], for this patient?   Check the care team in [facility] and look for a) attending/consulting [facility] provider listed and b) the [facility] team listed Log into [email or web address] and use [place]'s universal password to access. If you do not have the password, please contact the hospital operator. Locate the [facility] provider you are looking for under [facility] and page to a number that you can be directly reached. If you still have difficulty reaching the provider, please page the [facility] (Director on Call) for the Hospitalists listed on amion for assistance.  07/31/2022, 7:43 PM

## 2022-07-31 NOTE — ED Notes (Signed)
Patient reports back pain 10/10. Circulation, sensation, and mobility intact  in bilateral lower extremities. Denies any episodes of incontinence.

## 2022-07-31 NOTE — Consult Note (Signed)
Reason for Consult:Thoracic nine fracture, thoracic epidural hematoma Referring Physician: Rambo, Fazekas is an 77 y.o. male.  HPI: whom fell twice in the last seven days. CT and MRI show a thoracic nine fracture. He is able to move both lower extremities. He states movement is painful. Reported his legs gave out while he was walking. Is being followed by Dr. Mardelle Matte in orthopaedics. He felt that the thoracic fracture was not something he could handle, he was not seen or examined by orthopaedics today. I was contacted by the ED for recommendations.   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 He has a 19.00 pack-year smoking history. He has never used smokeless tobacco. He reports that he does not drink alcohol and does not use drugs.  Allergies:  Allergies  Allergen Reactions   Lisinopril Other (See Comments)    Cough    Medications: I have  reviewed the patient's current medications.  Results for orders placed or performed during the hospital encounter of 07/31/22 (from the past 48 hour(s))  CBC with Differential     Status: Abnormal   Collection Time: 07/31/22  4:30 PM  Result Value Ref Range   WBC 6.3 4.0 - 10.5 K/uL   RBC 4.90 4.22 - 5.81 MIL/uL   Hemoglobin 13.3 13.0 - 17.0 g/dL   HCT 43.9 39.0 - 52.0 %   MCV 89.6 80.0 - 100.0 fL   MCH 27.1 26.0 - 34.0 pg   MCHC 30.3 30.0 - 36.0 g/dL   RDW 19.1 (H) 11.5 - 15.5 %   Platelets 207 150 - 400 K/uL   nRBC 0.0 0.0 - 0.2 %   Neutrophils Relative % 71 %   Neutro Abs 4.5 1.7 - 7.7 K/uL   Lymphocytes Relative 19 %   Lymphs Abs 1.2 0.7 - 4.0 K/uL   Monocytes Relative 8 %   Monocytes Absolute 0.5 0.1 - 1.0 K/uL   Eosinophils Relative 1 %   Eosinophils Absolute 0.1 0.0 - 0.5 K/uL   Basophils Relative 1 %   Basophils Absolute 0.0 0.0 - 0.1 K/uL   Immature Granulocytes 0 %   Abs Immature Granulocytes 0.02 0.00 - 0.07 K/uL    Comment: Performed at [facility]  [facility], [place] [address] [place], [place] [id number]  Basic metabolic panel     Status: Abnormal   Collection Time: 07/31/22  4:30 PM  Result Value Ref Range   Sodium 140 135 - 145 mmol/L   Potassium 3.7 3.5 - 5.1 mmol/L   Chloride 105 98 - 111 mmol/L   CO2 24 22 - 32 mmol/L   Glucose, Bld 109 (H) 70 - 99 mg/dL    Comment: Glucose reference range applies only to samples taken after fasting for at least 8 hours.   BUN 14 8 - 23 mg/dL   Creatinine, Ser 0.92 0.61 - 1.24 mg/dL   Calcium 8.7 (L) 8.9 - 10.3 mg/dL   GFR, Estimated >60 >60 mL/min    Comment: (NOTE) Calculated using the CKD-EPI Creatinine Equation (2021)    Anion gap 11 5 - 15    Comment: Performed at [facility] [address], [place], [place] [address]    MR THORACIC SPINE WO CONTRAST  Addendum Date: 07/31/2022   ADDENDUM REPORT: 07/31/2022 19:29 ADDENDUM: Critical Value/emergent results were called by telephone at the time of interpretation  on 07/31/2022 at 7:25 pm to provider Dr. Trilby Drummer, who verbally acknowledged these results. Electronically Signed   By: Emmit Alexanders M.D.   On: 07/31/2022 19:29   Result Date: 07/31/2022 CLINICAL DATA:  Mid and low back pain, neuro deficit.  Trauma. EXAM: MRI THORACIC AND LUMBAR SPINE WITHOUT CONTRAST TECHNIQUE: Multiplanar and multiecho pulse sequences of the thoracic and lumbar spine were obtained without intravenous contrast. COMPARISON:  CT thoracic and lumbar spine 07/31/2022. FINDINGS: MRI THORACIC SPINE FINDINGS Alignment:  Normal. Vertebrae: Redemonstrated fracture through the T9-10 disc space with extension into the posterior elements at T9. Associated mild marrow edema in the posterior aspect of the T10 superior endplate without extension into the posterior elements. Cord: Dorsal epidural hematoma centered at T8-9, extending from T7 to T10, with compression of the spinal cord at the T10-11 level. No definite spinal cord edema. Paraspinal and other soft tissues: Mild bilateral paravertebral edema and hematoma centered around the T9-10 level. Redemonstration of small bilateral pleural effusions. Disc levels: No significant degenerative change. MRI LUMBAR SPINE FINDINGS Segmentation:  Standard. Alignment:  Physiologic. Vertebrae:  No fracture, evidence of discitis, or bone lesion. Conus medullaris and cauda equina: Conus extends to the L1 level. Conus and cauda equina appear normal. Paraspinal and other soft tissues: Unremarkable. Disc levels: Mild lower lumbar facet arthropathy without significant spinal canal stenosis or neural foraminal narrowing. IMPRESSION: 1. Confirmed dorsal epidural hematoma centered at T8-9, extending from T7 to T10, with compression of the spinal cord at the T10-11 level. No definite spinal cord edema. 2. Redemonstrated fracture through the T9-10 disc space with extension into the posterior elements at T9. Associated mild marrow edema in the posterior aspect of the T10 superior  endplate without extension into the posterior elements. Radiology assistant personnel have been notified to put me in telephone contact with the referring physician or the referring physician's clinical representative in order to discuss these findings. Once this communication is established I will issue an addendum to this report for documentation purposes. Electronically Signed: By: Emmit Alexanders M.D. On: 07/31/2022 19:15   MR LUMBAR SPINE WO CONTRAST  Addendum Date: 07/31/2022   ADDENDUM REPORT: 07/31/2022 19:29 ADDENDUM: Critical Value/emergent results were called by telephone at the time of interpretation on 07/31/2022 at 7:25 pm to provider Dr. Trilby Drummer, who verbally acknowledged these results. Electronically Signed   By:  Emmit Alexanders M.D.   On: 07/31/2022 19:29   Result Date: 07/31/2022 CLINICAL DATA:  Mid and low back pain, neuro deficit.  Trauma. EXAM: MRI THORACIC AND LUMBAR SPINE WITHOUT CONTRAST TECHNIQUE: Multiplanar and multiecho pulse sequences of the thoracic and lumbar spine were obtained without intravenous contrast. COMPARISON:  CT thoracic and lumbar spine 07/31/2022. FINDINGS: MRI THORACIC SPINE FINDINGS Alignment:  Normal. Vertebrae: Redemonstrated fracture through the T9-10 disc space with extension into the posterior elements at T9. Associated mild marrow edema in the posterior aspect of the T10 superior endplate without extension into the posterior elements. Cord: Dorsal epidural hematoma centered at T8-9, extending from T7 to T10, with compression of the spinal cord at the T10-11 level. No definite spinal cord edema. Paraspinal and other soft tissues: Mild bilateral paravertebral edema and hematoma centered around the T9-10 level. Redemonstration of small bilateral pleural effusions. Disc levels: No significant degenerative change. MRI LUMBAR SPINE FINDINGS Segmentation:  Standard. Alignment:  Physiologic. Vertebrae:  No fracture, evidence of discitis, or bone lesion. Conus  medullaris and cauda equina: Conus extends to the L1 level. Conus and cauda equina appear normal. Paraspinal and other soft tissues: Unremarkable. Disc levels: Mild lower lumbar facet arthropathy without significant spinal canal stenosis or neural foraminal narrowing. IMPRESSION: 1. Confirmed dorsal epidural hematoma centered at T8-9, extending from T7 to T10, with compression of the spinal cord at the T10-11 level. No definite spinal cord edema. 2. Redemonstrated fracture through the T9-10 disc space with extension into the posterior elements at T9. Associated mild marrow edema in the posterior aspect of the T10 superior endplate without extension into the posterior elements. Radiology assistant personnel have been notified to put me in telephone contact with the referring physician or the referring physician's clinical representative in order to discuss these findings. Once this communication is established I will issue an addendum to this report for documentation purposes. Electronically Signed: By: Emmit Alexanders M.D. On: 07/31/2022 19:15   CT Lumbar Spine Wo Contrast  Result Date: 07/31/2022 CLINICAL DATA:  Back trauma.  Pain. EXAM: CT THORACIC AND LUMBAR SPINE WITHOUT CONTRAST TECHNIQUE: Multidetector CT imaging of the thoracic and lumbar spine was performed without contrast. Multiplanar CT image reconstructions were also generated. RADIATION DOSE REDUCTION: This exam was performed according to the departmental dose-optimization program which includes automated exposure control, adjustment of the mA and/or kV according to patient size and/or use of iterative reconstruction technique. COMPARISON:  None Available. FINDINGS: CT THORACIC SPINE FINDINGS Alignment: Normal. Vertebrae: Contiguous anterior syndesmophytes and ossification of the supraspinous ligament, suggestive of ankylosing spondylitis. Fracture through the T9-10 disc space extending into the posterior aspect of the inferior endplate of T9,  through the posterior elements of T9 bilaterally (sagittal images 24 and 34 series 9). Findings suggestive of possible epidural hematoma in the dorsal soft tissues at the level of the fracture with moderate mass effect on the thecal sac (axial image 104 series 4). Paraspinal and other soft tissues: Dilated, fluid-filled esophagus. Small bilateral pleural effusions. Disc levels: No significant degenerative change. CT LUMBAR SPINE FINDINGS Segmentation: Conventional numbering is assumed with 5 non-rib-bearing, lumbar type vertebral bodies. Alignment: Normal. Vertebrae: Contiguous anterior syndesmophytes continue through the L2 level with ossification of the supraspinous ligament continuing through the L1 level. No acute fracture of the lumbar spine. Paraspinal and other soft tissues: Atherosclerotic calcifications of the abdominal aorta and its branches. Disc levels: No significant degenerative change. IMPRESSION: 1. Acute fracture through the T9-10 disc space extending into the posterior aspect  of the inferior endplate of T9, through the posterior elements of T9 bilaterally. 2. Findings suggestive of possible epidural hematoma in the dorsal soft tissues at the level of the fracture with moderate mass effect on the thecal sac. 3. Contiguous anterior syndesmophytes and ossification of the supraspinous ligament, suggestive of underlying ankylosing spondylitis. 4. No acute fracture of the lumbar spine. 5. Small bilateral pleural effusions. 6. Dilated, fluid-filled esophagus. 7. Aortic Atherosclerosis (ICD10-I70.0). Critical Value/emergent results were called by telephone at the time of interpretation on 07/31/2022 at 3:50 pm to provider RILEY RANSOM , who verbally acknowledged these results. Electronically Signed   By: Emmit Alexanders M.D.   On: 07/31/2022 15:56   CT Thoracic Spine Wo Contrast  Result Date: 07/31/2022 CLINICAL DATA:  Back trauma.  Pain. EXAM: CT THORACIC AND LUMBAR SPINE WITHOUT CONTRAST TECHNIQUE:  Multidetector CT imaging of the thoracic and lumbar spine was performed without contrast. Multiplanar CT image reconstructions were also generated. RADIATION DOSE REDUCTION: This exam was performed according to the departmental dose-optimization program which includes automated exposure control, adjustment of the mA and/or kV according to patient size and/or use of iterative reconstruction technique. COMPARISON:  None Available. FINDINGS: CT THORACIC SPINE FINDINGS Alignment: Normal. Vertebrae: Contiguous anterior syndesmophytes and ossification of the supraspinous ligament, suggestive of ankylosing spondylitis. Fracture through the T9-10 disc space extending into the posterior aspect of the inferior endplate of T9, through the posterior elements of T9 bilaterally (sagittal images 24 and 34 series 9). Findings suggestive of possible epidural hematoma in the dorsal soft tissues at the level of the fracture with moderate mass effect on the thecal sac (axial image 104 series 4). Paraspinal and other soft tissues: Dilated, fluid-filled esophagus. Small bilateral pleural effusions. Disc levels: No significant degenerative change. CT LUMBAR SPINE FINDINGS Segmentation: Conventional numbering is assumed with 5 non-rib-bearing, lumbar type vertebral bodies. Alignment: Normal. Vertebrae: Contiguous anterior syndesmophytes continue through the L2 level with ossification of the supraspinous ligament continuing through the L1 level. No acute fracture of the lumbar spine. Paraspinal and other soft tissues: Atherosclerotic calcifications of the abdominal aorta and its branches. Disc levels: No significant degenerative change. IMPRESSION: 1. Acute fracture through the T9-10 disc space extending into the posterior aspect of the inferior endplate of T9, through the posterior elements of T9 bilaterally. 2. Findings suggestive of possible epidural hematoma in the dorsal soft tissues at the level of the fracture with moderate mass  effect on the thecal sac. 3. Contiguous anterior syndesmophytes and ossification of the supraspinous ligament, suggestive of underlying ankylosing spondylitis. 4. No acute fracture of the lumbar spine. 5. Small bilateral pleural effusions. 6. Dilated, fluid-filled esophagus. 7. Aortic Atherosclerosis (ICD10-I70.0). Critical Value/emergent results were called by telephone at the time of interpretation on 07/31/2022 at 3:50 pm to provider RILEY RANSOM , who verbally acknowledged these results. Electronically Signed   By: Emmit Alexanders M.D.   On: 07/31/2022 15:56   DG Knee Complete 4 Views Right  Result Date: 07/31/2022 CLINICAL DATA:  Fall EXAM: RIGHT KNEE - COMPLETE 4+ VIEW COMPARISON:  None Available. FINDINGS: There is an acute comminuted nondisplaced fracture of the proximal fibular diaphysis. There is no dislocation. There is overlying soft tissue swelling. No significant joint effusion. IMPRESSION: Acute comminuted nondisplaced fracture of the proximal fibular diaphysis. Electronically Signed   By: Ronney Asters M.D.   On: 07/31/2022 15:24    Review of Systems Blood pressure (!) 146/64, pulse 74, temperature 97.9 F (36.6 C), temperature source Oral, resp. rate 16, height  6' (1.829 m), weight 113.4 kg, SpO2 96 %. Physical Exam Constitutional:      Appearance: Normal appearance. He is obese.  HENT:     Head: Normocephalic and atraumatic.     Right Ear: External ear normal.     Left Ear: External ear normal.     Nose: Nose normal.     Mouth/Throat:     Mouth: Mucous membranes are moist.     Pharynx: Oropharynx is clear.  Eyes:     Extraocular Movements: Extraocular movements intact.     Pupils: Pupils are equal, round, and reactive to light.  Cardiovascular:     Rate and Rhythm: Normal rate and regular rhythm.  Pulmonary:     Effort: Pulmonary effort is normal.  Abdominal:     Palpations: Abdomen is soft.  Musculoskeletal:     Cervical back: Normal range of motion.     Comments:  Reports pain with movement of lower extremities, less than a full range of motion  Neurological:     Mental Status: He is alert and oriented to person, place, and time. Mental status is at baseline.     Cranial Nerves: Cranial nerves 2-12 are intact.     Sensory: Sensation is intact.     Motor: Motor function is intact.     Coordination: Coordination is intact.     Comments: Gait not assessed  Psychiatric:        Mood and Affect: Mood normal.        Behavior: Behavior normal.     Assessment/Plan: FLETCHER HILLMER is a 77 y.o. male With normal movement in the lower extremities. Do not believe operative fixation of the fracture is needed at this time, nor laminectomy due to epidural hematoma. Will monitor closely. Bowel and bladder function is normal at this time. Bedrest for now. No chemical dvt prophylaxis given the epidural hematoma. Would place SCD's. No brace at this time.  Ashok Pall 07/31/2022, 8:02 PM

## 2022-07-31 NOTE — ED Provider Notes (Signed)
[place] EMERGENCY DEPARTMENT AT [facility] Provider Note   CSN: [id number] Arrival date & time: 07/31/22  1336     History Chief Complaint  Patient presents with   Back Pain    S/p mechanical fall on 3/2, then again on 3/12. Patient presents with worsening back pain after fall yesterday. Distal CMS intact.     HPI Kenneth Watts is a 77 y.o. male presenting for back pain and multiple ground-level falls. Has been followed by orthopedic surgery in the outpatient setting.  Has had severe back pain.  Has had 2 falls this week.  Was seen after his first 1 but is not been seen since the second 1 yesterday where he fell directly onto his right side.  Denies fevers chills nausea vomiting syncope shortness of breath.  Denies any neurologic symptoms.  Patient's recorded medical, surgical, social, medication list and allergies were reviewed in the Snapshot window as part of the initial history.   Review of Systems   Review of Systems  Constitutional:  Negative for chills and fever.  HENT:  Negative for ear pain and sore throat.   Eyes:  Negative for pain and visual disturbance.  Respiratory:  Negative for cough and shortness of breath.   Cardiovascular:  Negative for chest pain and palpitations.  Gastrointestinal:  Negative for abdominal pain and vomiting.  Genitourinary:  Negative for dysuria and hematuria.  Musculoskeletal:  Positive for back pain. Negative for arthralgias.  Skin:  Negative for color change and rash.  Neurological:  Negative for seizures and syncope.  All other systems reviewed and are negative.   Physical Exam Updated Vital Signs BP 134/71   Pulse 70   Temp 97.9 F (36.6 C) (Oral)   Resp 13   Ht 6' (1.829 m)   Wt 113.4 kg   SpO2 97%   BMI 33.91 kg/m  Physical Exam Vitals and nursing note reviewed.  Constitutional:      General: He is not in acute distress.    Appearance: He is well-developed.  HENT:     Head: Normocephalic and atraumatic.   Eyes:     Conjunctiva/sclera: Conjunctivae normal.  Cardiovascular:     Rate and Rhythm: Normal rate and regular rhythm.     Heart sounds: No murmur heard. Pulmonary:     Effort: Pulmonary effort is normal. No respiratory distress.     Breath sounds: Normal breath sounds.  Abdominal:     Palpations: Abdomen is soft.     Tenderness: There is no abdominal tenderness.  Musculoskeletal:        General: No swelling.     Cervical back: Neck supple.  Skin:    General: Skin is warm and dry.     Capillary Refill: Capillary refill takes less than 2 seconds.  Neurological:     Mental Status: He is alert.  Psychiatric:        Mood and Affect: Mood normal.      ED Course/ Medical Decision Making/ A&P Clinical Course as of 08/01/22 0036  Wed Jul 31, 2022  1602 GLF on march 2nd Through T9/T10 disc space Larey Seat again yesterday  [CC]  1613 Called [facility].  Dr. Dion Saucier recommended local spine consultation. [CC]    Clinical Course User Index [CC] Kenneth Ade, MD    Procedures .Critical Care  Performed by: Kenneth Ade, MD Authorized by: Kenneth Ade, MD   Critical care provider statement:    Critical care time (minutes):  35  Critical care was necessary to treat or prevent imminent or life-threatening deterioration of the following conditions:  Trauma (Pain control x 4 total doses)   Critical care was time spent personally by me on the following activities:  Development of treatment plan with patient or surrogate, discussions with consultants, evaluation of patient's response to treatment, examination of patient, ordering and review of laboratory studies, ordering and review of radiographic studies, ordering and performing treatments and interventions, pulse oximetry, re-evaluation of patient's condition and review of old charts    Medications Ordered in ED Medications  oxyCODONE (Oxy IR/ROXICODONE) immediate release tablet 10 mg (10 mg Oral Given  07/31/22 1851)  sodium chloride flush (NS) 0.9 % injection 3 mL (3 mLs Intravenous Given 07/31/22 2233)  acetaminophen (TYLENOL) tablet 650 mg (has no administration in time range)    Or  acetaminophen (TYLENOL) suppository 650 mg (has no administration in time range)  HYDROcodone-acetaminophen (NORCO/VICODIN) 5-325 MG per tablet 1-2 tablet (has no administration in time range)  HYDROmorphone (DILAUDID) injection 0.5-1 mg (has no administration in time range)  methocarbamol (ROBAXIN) 500 mg in dextrose 5 % 50 mL IVPB (500 mg Intravenous New Bag/Given 07/31/22 2236)  polyethylene glycol (MIRALAX / GLYCOLAX) packet 17 g (has no administration in time range)  sorbitol 70 % solution 30 mL (has no administration in time range)  methocarbamol (ROBAXIN) 500 mg in dextrose 5 % 50 mL IVPB (0 mg Intravenous Hold 07/31/22 2045)  ziprasidone (GEODON) capsule 60 mg (60 mg Oral Given 07/31/22 2231)  ziprasidone (GEODON) capsule 80 mg (80 mg Oral Given 07/31/22 2230)  oxyCODONE (Oxy IR/ROXICODONE) immediate release tablet 5 mg (5 mg Oral Given 07/31/22 1451)  fentaNYL (SUBLIMAZE) injection 100 mcg (100 mcg Intravenous Given 07/31/22 1626)  acetaminophen (TYLENOL) tablet 1,000 mg (1,000 mg Oral Given 07/31/22 1850)  ketorolac (TORADOL) 15 MG/ML injection 15 mg (15 mg Intravenous Given 07/31/22 1851)   Medical Decision Making:    Kenneth Watts is a 77 y.o. male who presented to the ED today with a moderate mechanisma trauma, detailed above.    Patient's presentation is complicated by their history of multiple comorbid medical problems.  Patient placed on continuous vitals and telemetry monitoring while in ED which was reviewed periodically.   Given this mechanism of trauma, a full physical exam was performed. Notably, patient was neurovascularly intact diffusely.   Reviewed and confirmed nursing documentation for past medical history, family history, social history.    Initial Assessment/Plan:   This is a  patient presenting with a moderate mechanism trauma.  As such, I have considered intracranial injuries including intracranial hemorrhage, intrathoracic injuries including blunt myocardial or blunt lung injury, blunt abdominal injuries including aortic dissection, bladder injury, spleen injury, liver injury and I have considered orthopedic injuries including extremity or spinal injury.  With the patient's presentation of moderate mechanism trauma but an otherwise reassuring exam, patient warrants targeted evaluation for potential traumatic injuries. Will proceed with targeted evaluation for potential injuries. Will proceed with CT spine. Objective evaluation resulted with through and through fracture.  Orthopedic surgery Dr. Dion Saucier was consulted who recommended local spinal surgical consultation. Spine surgery Dr. Kathaleen Grinder was consulted who evaluated at bedside and recommended MRIs. Patient required frequent neurologic checks due to possibility of epidural hematoma per the initial CT report.  Pending MRI reports at times of admission to hospitalist.  Patient will require follow-up with MRI, ongoing care and management..   Disposition:   Based on the above findings, I believe this patient  is stable for admission.    Patient/family educated about specific findings on our evaluation and explained exact reasons for admission.  Patient/family educated about clinical situation and time was allowed to answer questions.   Admission team communicated with and agreed with need for admission. Patient admitted. Patient ready to move at this time.     Emergency Department Medication Summary:   Medications  oxyCODONE (Oxy IR/ROXICODONE) immediate release tablet 10 mg (10 mg Oral Given 07/31/22 1851)  sodium chloride flush (NS) 0.9 % injection 3 mL (3 mLs Intravenous Given 07/31/22 2233)  acetaminophen (TYLENOL) tablet 650 mg (has no administration in time range)    Or  acetaminophen (TYLENOL) suppository 650 mg (has  no administration in time range)  HYDROcodone-acetaminophen (NORCO/VICODIN) 5-325 MG per tablet 1-2 tablet (has no administration in time range)  HYDROmorphone (DILAUDID) injection 0.5-1 mg (has no administration in time range)  methocarbamol (ROBAXIN) 500 mg in dextrose 5 % 50 mL IVPB (500 mg Intravenous New Bag/Given 07/31/22 2236)  polyethylene glycol (MIRALAX / GLYCOLAX) packet 17 g (has no administration in time range)  sorbitol 70 % solution 30 mL (has no administration in time range)  methocarbamol (ROBAXIN) 500 mg in dextrose 5 % 50 mL IVPB (0 mg Intravenous Hold 07/31/22 2045)  ziprasidone (GEODON) capsule 60 mg (60 mg Oral Given 07/31/22 2231)  ziprasidone (GEODON) capsule 80 mg (80 mg Oral Given 07/31/22 2230)  oxyCODONE (Oxy IR/ROXICODONE) immediate release tablet 5 mg (5 mg Oral Given 07/31/22 1451)  fentaNYL (SUBLIMAZE) injection 100 mcg (100 mcg Intravenous Given 07/31/22 1626)  acetaminophen (TYLENOL) tablet 1,000 mg (1,000 mg Oral Given 07/31/22 1850)  ketorolac (TORADOL) 15 MG/ML injection 15 mg (15 mg Intravenous Given 07/31/22 1851)        Clinical Impression:  1. Fall, initial encounter      Admit   Final Clinical Impression(s) / ED Diagnoses Final diagnoses:  Fall, initial encounter    Rx / DC Orders ED Discharge Orders     None         Kenneth Ade, MD 08/01/22 [phone]

## 2022-08-01 ENCOUNTER — Inpatient Hospital Stay (HOSPITAL_COMMUNITY): Payer: Medicare Other

## 2022-08-01 DIAGNOSIS — S22079A Unspecified fracture of T9-T10 vertebra, initial encounter for closed fracture: Secondary | ICD-10-CM | POA: Diagnosis not present

## 2022-08-01 LAB — COMPREHENSIVE METABOLIC PANEL
ALT: 31 U/L (ref 0–44)
AST: 40 U/L (ref 15–41)
Albumin: 3 g/dL — ABNORMAL LOW (ref 3.5–5.0)
Alkaline Phosphatase: 49 U/L (ref 38–126)
Anion gap: 9 (ref 5–15)
BUN: 14 mg/dL (ref 8–23)
CO2: 26 mmol/L (ref 22–32)
Calcium: 8.4 mg/dL — ABNORMAL LOW (ref 8.9–10.3)
Chloride: 105 mmol/L (ref 98–111)
Creatinine, Ser: 0.88 mg/dL (ref 0.61–1.24)
GFR, Estimated: >60 mL/min (ref >60)
Glucose, Bld: 92 mg/dL (ref 70–99)
Potassium: 3.3 mmol/L — ABNORMAL LOW (ref 3.5–5.1)
Sodium: 140 mmol/L (ref 135–145)
Total Bilirubin: 0.3 mg/dL (ref 0.3–1.2)
Total Protein: 5.6 g/dL — ABNORMAL LOW (ref 6.5–8.1)

## 2022-08-01 LAB — CBC
HCT: 39.3 % (ref 39.0–52.0)
Hemoglobin: 12 g/dL — ABNORMAL LOW (ref 13.0–17.0)
MCH: 27.4 pg (ref 26.0–34.0)
MCHC: 30.5 g/dL (ref 30.0–36.0)
MCV: 89.7 fL (ref 80.0–100.0)
Platelets: 169 10*3/uL (ref 150–400)
RBC: 4.38 MIL/uL (ref 4.22–5.81)
RDW: 19 % — ABNORMAL HIGH (ref 11.5–15.5)
WBC: 5.4 10*3/uL (ref 4.0–10.5)
nRBC: 0 % (ref 0.0–0.2)

## 2022-08-01 MED ORDER — POTASSIUM CHLORIDE CRYS ER 20 MEQ PO TBCR
40.0000 meq | EXTENDED_RELEASE_TABLET | Freq: Once | ORAL | Status: AC
  Administered 2022-08-01: 40 meq via ORAL
  Filled 2022-08-01: qty 2

## 2022-08-01 MED ORDER — ACETAMINOPHEN 500 MG PO TABS
1000.0000 mg | ORAL_TABLET | Freq: Three times a day (TID) | ORAL | Status: DC
  Administered 2022-08-01 – 2022-08-07 (×14): 1000 mg via ORAL
  Filled 2022-08-01 (×18): qty 2

## 2022-08-01 NOTE — Progress Notes (Signed)
Patient seen and examined.  Agree with plan as documented. Will need SNF placement.  Fibula Fracture, spine fractures, and epidural hematoma.   Ankle looks ok.   Johnny Bridge, MD

## 2022-08-01 NOTE — Progress Notes (Addendum)
PROGRESS NOTE  Kenneth Watts  DOB: July 10, 1945  PCP: [name], MD [id number]  DOA: 07/31/2022  LOS: 1 day  Hospital Day: 2  Brief narrative: Kenneth Watts is a 77 y.o. male with PMH significant for obesity, HTN, HLD, OSA on CPAP and nightly O2, COPD, gout, dyspepsia, schizophrenia, anxiety, diverticulosis. 3/13, patient presented to the ED with complaint of severe back pain after recent falls. Patient reported 2 falls in the past week.  He feels he is legs are weak and give out while walking. He saw orthopedics Dr. Mardelle Matte a few days ago.  X-rays were done showing some compression fractures.  Most recent fall on 3/12 caused severe pain and difficulty walking and hence presented to the ED.  In the ED, afebrile, breathing on room air, heart rate in 60s blood pressure in 140s Initial labs with unremarkable CBC, BMP X-ray right knee showed acute comminuted nondisplaced fracture of the proximal fibular diaphysis. CT thoracic and lumbar spine showed 1. Acute fracture through the T9-10 disc space extending into the posterior aspect of the inferior endplate of T9, through the posterior elements of T9 bilaterally. 2. Findings suggestive of possible epidural hematoma in the dorsal soft tissues at the level of the fracture with moderate mass effect on the thecal sac. 3. Contiguous anterior syndesmophytes and ossification of the supraspinous ligament, suggestive of underlying ankylosing spondylitis. 4. No acute fracture of the lumbar spine. MRI thoracic and lumbar spine confirmed the finding noted earlier in the CT scan.    Patient was seen by neurosurgeon Dr. Christella Noa Admitted to [facility]  Subjective: Patient was seen and examined this morning.  Pleasant elderly Caucasian male.  Lying on bed.  Controlling his movement because back pain. Remains hemodynamically stable overnight  Assessment and plan: Acute T9 fracture Epidural hematoma Secondary to several recent falls CT/MRI  thoracic and lumbar spine as above. Neurosurgeon Dr. Christella Noa saw the patient Per neurosurgery note, no neurological deficit at this time given patient's ability to move both legs and lack of urinary or bowel incontinence. No surgical intervention recommended at this time. Continue to monitor.  Avoid chemical DVT prophylaxis given epidural hematoma. PT/OT eval ordered For pain control, I started him on TLSO brace, scheduled Tylenol 1 g 3 times daily, as needed oxycodone 10 mg every 6 hours and as needed IV Dilaudid for severe pain.  Also on Robaxin IV as needed   Acute comminuted nondisplaced fracture of the proximal fibular diaphysis Seen by orthopedics.  Noted to plan to obtain an ankle x-ray.  If no other injury, may be WBAT RLE.  falls  impaired mobility Getting to patient's son, patient was less active for the last few months after hospitalization for GI bleed and iron deficiency anemia.  Just recently started to be more active but he still had not recovered.   He fell twice in last 1 week.  No other neurological deficits noted on exam. PT eval pending.  If loss of balance noted, may consider brain imaging.  Schizophrenia Anxiety Continue home Geodon   OSA Continue home CPAP with nocturnal oxygen  Dilated, fluid-filled esophagus Noted in CT thoracic spine No symptoms of reflux, gastritis, dysphagia. Follow-up as an outpatient  Aortic Atherosclerosis In his med list, I do not see any antiplatelet or statin.  Definitely not starting blood thinner at this time given epidural hematoma.  Please follow-up with PCP.   Mobility: PT eval pending  Goals of care   Code Status: Full Code  DVT prophylaxis:  SCDs Start: 07/31/22 1843   Antimicrobials: None Fluid: None Consultants: Orthopedics, neurosurgery Family Communication: Called and discussed with patient's son Mr. [facility] this afternoon.  Status: Inpatient Level of care:  Telemetry Surgical   Needs to  continue in-hospital care:  Needs adequate pain control, mobility.  May need to go to SNF/CIR  Prognosis:  Reports good functional status until recently.  If he recovers well from this back injury, prognosis is good  Patient from: Home Anticipated d/c to: Pending pain control and PT recommendation     Scheduled Meds:  acetaminophen  1,000 mg Oral TID   sodium chloride flush  3 mL Intravenous Q12H   ziprasidone  60 mg Oral QPM   ziprasidone  80 mg Oral QPM    PRN meds: HYDROmorphone (DILAUDID) injection, methocarbamol (ROBAXIN) IV, oxyCODONE, polyethylene glycol, sorbitol   Infusions:   methocarbamol (ROBAXIN) IV Stopped (07/31/22 2304)    Diet:  Diet Order             Diet regular Room service appropriate? Yes; Fluid consistency: Thin  Diet effective now                   Antimicrobials: Anti-infectives (From admission, onward)    None       Skin assessment:       Nutritional status:  Body mass index is 33.91 kg/m.          Objective: Vitals:   08/01/22 0830 08/01/22 1030  BP: (!) 106/55 (!) 162/61  Pulse: 81 (!) 57  Resp: 18 17  Temp:  98 F (36.7 C)  SpO2: 97% 96%    Intake/Output Summary (Last 24 hours) at 08/01/2022 1253 Last data filed at 07/31/2022 2304 Gross per 24 hour  Intake 48.05 ml  Output --  Net 48.05 ml   Filed Weights   07/31/22 1340  Weight: 113.4 kg   Weight change:  Body mass index is 33.91 kg/m.   Physical Exam: General exam: Pleasant, elderly Caucasian male.  In distress from pain Skin: No rashes, lesions or ulcers. HEENT: Atraumatic, normocephalic, no obvious bleeding Lungs: Clear to auscultation bilaterally CVS: Regular rate and rhythm, no murmur GI/Abd soft, nontender, nondistended, bowel sound present CNS: Alert, awake, oriented x 3 Back: Tenderness present in midline as well as paraspinal tenderness present in mid back Psychiatry: Mood appropriate Extremities: No pedal edema, no calf  tenderness.  Data Review: I have personally reviewed the laboratory data and studies available.  F/u labs ordered. Unresulted Labs (From admission, onward)    None       Total time spent in review of labs and imaging, patient evaluation, formulation of plan, documentation and communication with family: 82 minutes  Signed, Terrilee Croak, MD [facility] 08/01/2022

## 2022-08-01 NOTE — ED Notes (Signed)
ED TO INPATIENT HANDOFF REPORT  ED Nurse Name and Phone #:  Su Grand [phone]  S Name/Age/Gender Kenneth Watts 77 y.o. male Room/Bed: 038C/038C  Code Status   Code Status: Full Code  Home/SNF/Other Home Patient oriented to: self, place, time, and situation Is this baseline? Yes   Triage Complete: Triage complete  Chief Complaint Thoracic spine fracture [facility]) [S22.009A]  Triage Note Pt bib ems from home; mechanical fall on 3/2; diagnosed with possible compression fractures; fell again yesterday, was not evaluated; c/o R knee tenderness, no obvious deformity; no loc, did not hit head, no thinners; 50 mcg fentanyl given pta, 20 ga rac; 157/74, HR 58, 98% RA; pain 2/10   Allergies Allergies  Allergen Reactions   Lisinopril Other (See Comments)    Cough    Level of Care/Admitting Diagnosis ED Disposition     ED Disposition  Admit   Condition  --   Comment  Hospital Area: [place] [[id number]]  Level of Care: Telemetry Surgical [105]  May admit patient to [facility] or [facility] if equivalent level of care is available:: No  Covid Evaluation: Asymptomatic - no recent exposure (last 10 days) testing not required  Diagnosis: Thoracic spine fracture [facility]) [id number]  Admitting Physician: Marcelyn Bruins [id number]  Attending Physician: Marcelyn Bruins [id number]  Certification:: I certify this patient will need inpatient services for at least 2 midnights  Estimated Length of Stay: 2          B Medical/Surgery History Past Medical History:  Diagnosis Date   Anxiety    Avascular necrosis of bones of both hips ([place])    COPD (chronic obstructive pulmonary disease) ([place])    no inhalers used   Diverticulitis    no recent problems   Hypertension    Melanoma ([place]) 2014   left arm   OSA (obstructive sleep apnea)    Oxygen dependent    3 liters with cpap set on 5 at hs   Primary localized osteoarthritis of left hip 01/19/2019   Schizophrenia  ([place])    "paranoid schizopherneic with serois anger issues, 3 personalities, multiple psych issues"   Past Surgical History:  Procedure Laterality Date   CHOLECYSTECTOMY     HERNIA REPAIR     chest   MELANOMA EXCISION Left 10/01/12   arm   TOTAL HIP ARTHROPLASTY Left 01/19/2019   Procedure: TOTAL HIP ARTHROPLASTY;  Surgeon: Marchia Bond, MD;  Location: [facility];  Service: Orthopedics;  Laterality: Left;     A IV Location/Drains/Wounds Patient Lines/Drains/Airways Status     Active Line/Drains/Airways     Name Placement date Placement time Site Days   Peripheral IV 07/31/22 20 G 1.16" Right Antecubital 07/31/22  1626  Antecubital  1   External Urinary Catheter 07/31/22  1714  --  1   Incision (Closed) 01/19/19 Hip Left 01/19/19  0922  -- 1290            Intake/Output Last 24 hours  Intake/Output Summary (Last 24 hours) at 08/01/2022 0714 Last data filed at 07/31/2022 2304 Gross per 24 hour  Intake 48.05 ml  Output --  Net 48.05 ml    Labs/Imaging Results for orders placed or performed during the hospital encounter of 07/31/22 (from the past 48 hour(s))  CBC with Differential     Status: Abnormal   Collection Time: 07/31/22  4:30 PM  Result Value Ref Range   WBC 6.3 4.0 - 10.5 K/uL   RBC 4.90 4.22 -  5.81 MIL/uL   Hemoglobin 13.3 13.0 - 17.0 g/dL   HCT 43.9 39.0 - 52.0 %   MCV 89.6 80.0 - 100.0 fL   MCH 27.1 26.0 - 34.0 pg   MCHC 30.3 30.0 - 36.0 g/dL   RDW 19.1 (H) 11.5 - 15.5 %   Platelets 207 150 - 400 K/uL   nRBC 0.0 0.0 - 0.2 %   Neutrophils Relative % 71 %   Neutro Abs 4.5 1.7 - 7.7 K/uL   Lymphocytes Relative 19 %   Lymphs Abs 1.2 0.7 - 4.0 K/uL   Monocytes Relative 8 %   Monocytes Absolute 0.5 0.1 - 1.0 K/uL   Eosinophils Relative 1 %   Eosinophils Absolute 0.1 0.0 - 0.5 K/uL   Basophils Relative 1 %   Basophils Absolute 0.0 0.0 - 0.1 K/uL   Immature Granulocytes 0 %   Abs Immature Granulocytes 0.02 0.00 - 0.07 K/uL    Comment: Performed at [facility] [address], [place], [place] [id number]  Basic metabolic panel     Status: Abnormal   Collection Time: 07/31/22  4:30 PM  Result Value Ref Range   Sodium 140 135 - 145 mmol/L   Potassium 3.7 3.5 - 5.1 mmol/L   Chloride 105 98 - 111 mmol/L   CO2 24 22 - 32 mmol/L   Glucose, Bld 109 (H) 70 - 99 mg/dL    Comment: Glucose reference range applies only to samples taken after fasting for at least 8 hours.   BUN 14 8 - 23 mg/dL   Creatinine, Ser 0.92 0.61 - 1.24 mg/dL   Calcium 8.7 (L) 8.9 - 10.3 mg/dL   GFR, Estimated >60 >60 mL/min    Comment: (NOTE) Calculated using the CKD-EPI Creatinine Equation (2021)    Anion gap 11 5 - 15    Comment: Performed at [facility] [address] [place], [place] [address]  CBC     Status: Abnormal   Collection Time: 08/01/22  5:58 AM  Result Value Ref Range   WBC 5.4 4.0 - 10.5 K/uL   RBC 4.38 4.22 - 5.81 MIL/uL   Hemoglobin 12.0 (L) 13.0 - 17.0 g/dL   HCT 39.3 39.0 - 52.0 %   MCV 89.7 80.0 - 100.0 fL   MCH 27.4 26.0 - 34.0 pg   MCHC 30.5 30.0 - 36.0 g/dL   RDW 19.0 (H) 11.5 - 15.5 %   Platelets 169 150 - 400 K/uL   nRBC 0.0 0.0 - 0.2 %    Comment: Performed at [facility], [place] [address] [place], [place] [address]   MR THORACIC SPINE WO CONTRAST  Addendum Date: 07/31/2022   ADDENDUM REPORT: 07/31/2022 19:29 ADDENDUM: Critical Value/emergent results were called by telephone at the time of interpretation on 07/31/2022 at 7:25 pm to provider Dr. Trilby Drummer, who verbally acknowledged these results. Electronically Signed   By: Emmit Alexanders M.D.   On: 07/31/2022 19:29   Result Date: 07/31/2022 CLINICAL DATA:  Mid and low back pain, neuro deficit.  Trauma. EXAM: MRI THORACIC AND LUMBAR SPINE WITHOUT CONTRAST TECHNIQUE: Multiplanar and multiecho pulse sequences of the thoracic and lumbar spine were obtained without intravenous contrast. COMPARISON:  CT thoracic and lumbar spine 07/31/2022. FINDINGS: MRI THORACIC SPINE FINDINGS  Alignment:  Normal. Vertebrae: Redemonstrated fracture through the T9-10 disc space with extension into the posterior elements at T9. Associated mild marrow edema in the posterior aspect of the T10 superior endplate without extension  into the posterior elements. Cord: Dorsal epidural hematoma centered at T8-9, extending from T7 to T10, with compression of the spinal cord at the T10-11 level. No definite spinal cord edema. Paraspinal and other soft tissues: Mild bilateral paravertebral edema and hematoma centered around the T9-10 level. Redemonstration of small bilateral pleural effusions. Disc levels: No significant degenerative change. MRI LUMBAR SPINE FINDINGS Segmentation:  Standard. Alignment:  Physiologic. Vertebrae:  No fracture, evidence of discitis, or bone lesion. Conus medullaris and cauda equina: Conus extends to the L1 level. Conus and cauda equina appear normal. Paraspinal and other soft tissues: Unremarkable. Disc levels: Mild lower lumbar facet arthropathy without significant spinal canal stenosis or neural foraminal narrowing. IMPRESSION: 1. Confirmed dorsal epidural hematoma centered at T8-9, extending from T7 to T10, with compression of the spinal cord at the T10-11 level. No definite spinal cord edema. 2. Redemonstrated fracture through the T9-10 disc space with extension into the posterior elements at T9. Associated mild marrow edema in the posterior aspect of the T10 superior endplate without extension into the posterior elements. Radiology assistant personnel have been notified to put me in telephone contact with the referring physician or the referring physician's clinical representative in order to discuss these findings. Once this communication is established I will issue an addendum to this report for documentation purposes. Electronically Signed: By: Emmit Alexanders M.D. On: 07/31/2022 19:15   MR LUMBAR SPINE WO CONTRAST  Addendum Date: 07/31/2022   ADDENDUM REPORT: 07/31/2022 19:29  ADDENDUM: Critical Value/emergent results were called by telephone at the time of interpretation on 07/31/2022 at 7:25 pm to provider Dr. Trilby Drummer, who verbally acknowledged these results. Electronically Signed   By: Emmit Alexanders M.D.   On: 07/31/2022 19:29   Result Date: 07/31/2022 CLINICAL DATA:  Mid and low back pain, neuro deficit.  Trauma. EXAM: MRI THORACIC AND LUMBAR SPINE WITHOUT CONTRAST TECHNIQUE: Multiplanar and multiecho pulse sequences of the thoracic and lumbar spine were obtained without intravenous contrast. COMPARISON:  CT thoracic and lumbar spine 07/31/2022. FINDINGS: MRI THORACIC SPINE FINDINGS Alignment:  Normal. Vertebrae: Redemonstrated fracture through the T9-10 disc space with extension into the posterior elements at T9. Associated mild marrow edema in the posterior aspect of the T10 superior endplate without extension into the posterior elements. Cord: Dorsal epidural hematoma centered at T8-9, extending from T7 to T10, with compression of the spinal cord at the T10-11 level. No definite spinal cord edema. Paraspinal and other soft tissues: Mild bilateral paravertebral edema and hematoma centered around the T9-10 level. Redemonstration of small bilateral pleural effusions. Disc levels: No significant degenerative change. MRI LUMBAR SPINE FINDINGS Segmentation:  Standard. Alignment:  Physiologic. Vertebrae:  No fracture, evidence of discitis, or bone lesion. Conus medullaris and cauda equina: Conus extends to the L1 level. Conus and cauda equina appear normal. Paraspinal and other soft tissues: Unremarkable. Disc levels: Mild lower lumbar facet arthropathy without significant spinal canal stenosis or neural foraminal narrowing. IMPRESSION: 1. Confirmed dorsal epidural hematoma centered at T8-9, extending from T7 to T10, with compression of the spinal cord at the T10-11 level. No definite spinal cord edema. 2. Redemonstrated fracture through the T9-10 disc space with extension into the  posterior elements at T9. Associated mild marrow edema in the posterior aspect of the T10 superior endplate without extension into the posterior elements. Radiology assistant personnel have been notified to put me in telephone contact with the referring physician or the referring physician's clinical representative in order to discuss these findings. Once this communication is established  I will issue an addendum to this report for documentation purposes. Electronically Signed: By: Emmit Alexanders M.D. On: 07/31/2022 19:15   CT Lumbar Spine Wo Contrast  Result Date: 07/31/2022 CLINICAL DATA:  Back trauma.  Pain. EXAM: CT THORACIC AND LUMBAR SPINE WITHOUT CONTRAST TECHNIQUE: Multidetector CT imaging of the thoracic and lumbar spine was performed without contrast. Multiplanar CT image reconstructions were also generated. RADIATION DOSE REDUCTION: This exam was performed according to the departmental dose-optimization program which includes automated exposure control, adjustment of the mA and/or kV according to patient size and/or use of iterative reconstruction technique. COMPARISON:  None Available. FINDINGS: CT THORACIC SPINE FINDINGS Alignment: Normal. Vertebrae: Contiguous anterior syndesmophytes and ossification of the supraspinous ligament, suggestive of ankylosing spondylitis. Fracture through the T9-10 disc space extending into the posterior aspect of the inferior endplate of T9, through the posterior elements of T9 bilaterally (sagittal images 24 and 34 series 9). Findings suggestive of possible epidural hematoma in the dorsal soft tissues at the level of the fracture with moderate mass effect on the thecal sac (axial image 104 series 4). Paraspinal and other soft tissues: Dilated, fluid-filled esophagus. Small bilateral pleural effusions. Disc levels: No significant degenerative change. CT LUMBAR SPINE FINDINGS Segmentation: Conventional numbering is assumed with 5 non-rib-bearing, lumbar type vertebral  bodies. Alignment: Normal. Vertebrae: Contiguous anterior syndesmophytes continue through the L2 level with ossification of the supraspinous ligament continuing through the L1 level. No acute fracture of the lumbar spine. Paraspinal and other soft tissues: Atherosclerotic calcifications of the abdominal aorta and its branches. Disc levels: No significant degenerative change. IMPRESSION: 1. Acute fracture through the T9-10 disc space extending into the posterior aspect of the inferior endplate of T9, through the posterior elements of T9 bilaterally. 2. Findings suggestive of possible epidural hematoma in the dorsal soft tissues at the level of the fracture with moderate mass effect on the thecal sac. 3. Contiguous anterior syndesmophytes and ossification of the supraspinous ligament, suggestive of underlying ankylosing spondylitis. 4. No acute fracture of the lumbar spine. 5. Small bilateral pleural effusions. 6. Dilated, fluid-filled esophagus. 7. Aortic Atherosclerosis (ICD10-I70.0). Critical Value/emergent results were called by telephone at the time of interpretation on 07/31/2022 at 3:50 pm to provider RILEY RANSOM , who verbally acknowledged these results. Electronically Signed   By: Emmit Alexanders M.D.   On: 07/31/2022 15:56   CT Thoracic Spine Wo Contrast  Result Date: 07/31/2022 CLINICAL DATA:  Back trauma.  Pain. EXAM: CT THORACIC AND LUMBAR SPINE WITHOUT CONTRAST TECHNIQUE: Multidetector CT imaging of the thoracic and lumbar spine was performed without contrast. Multiplanar CT image reconstructions were also generated. RADIATION DOSE REDUCTION: This exam was performed according to the departmental dose-optimization program which includes automated exposure control, adjustment of the mA and/or kV according to patient size and/or use of iterative reconstruction technique. COMPARISON:  None Available. FINDINGS: CT THORACIC SPINE FINDINGS Alignment: Normal. Vertebrae: Contiguous anterior syndesmophytes and  ossification of the supraspinous ligament, suggestive of ankylosing spondylitis. Fracture through the T9-10 disc space extending into the posterior aspect of the inferior endplate of T9, through the posterior elements of T9 bilaterally (sagittal images 24 and 34 series 9). Findings suggestive of possible epidural hematoma in the dorsal soft tissues at the level of the fracture with moderate mass effect on the thecal sac (axial image 104 series 4). Paraspinal and other soft tissues: Dilated, fluid-filled esophagus. Small bilateral pleural effusions. Disc levels: No significant degenerative change. CT LUMBAR SPINE FINDINGS Segmentation: Conventional numbering is assumed with 5 non-rib-bearing,  lumbar type vertebral bodies. Alignment: Normal. Vertebrae: Contiguous anterior syndesmophytes continue through the L2 level with ossification of the supraspinous ligament continuing through the L1 level. No acute fracture of the lumbar spine. Paraspinal and other soft tissues: Atherosclerotic calcifications of the abdominal aorta and its branches. Disc levels: No significant degenerative change. IMPRESSION: 1. Acute fracture through the T9-10 disc space extending into the posterior aspect of the inferior endplate of T9, through the posterior elements of T9 bilaterally. 2. Findings suggestive of possible epidural hematoma in the dorsal soft tissues at the level of the fracture with moderate mass effect on the thecal sac. 3. Contiguous anterior syndesmophytes and ossification of the supraspinous ligament, suggestive of underlying ankylosing spondylitis. 4. No acute fracture of the lumbar spine. 5. Small bilateral pleural effusions. 6. Dilated, fluid-filled esophagus. 7. Aortic Atherosclerosis (ICD10-I70.0). Critical Value/emergent results were called by telephone at the time of interpretation on 07/31/2022 at 3:50 pm to provider RILEY RANSOM , who verbally acknowledged these results. Electronically Signed   By: Emmit Alexanders  M.D.   On: 07/31/2022 15:56   DG Knee Complete 4 Views Right  Result Date: 07/31/2022 CLINICAL DATA:  Fall EXAM: RIGHT KNEE - COMPLETE 4+ VIEW COMPARISON:  None Available. FINDINGS: There is an acute comminuted nondisplaced fracture of the proximal fibular diaphysis. There is no dislocation. There is overlying soft tissue swelling. No significant joint effusion. IMPRESSION: Acute comminuted nondisplaced fracture of the proximal fibular diaphysis. Electronically Signed   By: Ronney Asters M.D.   On: 07/31/2022 15:24    Pending Labs Unresulted Labs (From admission, onward)     Start     Ordered   08/01/22 0500  Comprehensive metabolic panel  Tomorrow morning,   R        07/31/22 1845            Vitals/Pain Today's Vitals   08/01/22 0153 08/01/22 0200 08/01/22 0308 08/01/22 0630  BP:  (!) 104/50 110/61 138/72  Pulse:  61 62 62  Resp:  '11 11 14  '$ Temp:   98.4 F (36.9 C) 97.9 F (36.6 C)  TempSrc:   Axillary   SpO2: 98% 100% 100% 100%  Weight:      Height:      PainSc:   0-No pain     Isolation Precautions No active isolations  Medications Medications  oxyCODONE (Oxy IR/ROXICODONE) immediate release tablet 10 mg (10 mg Oral Given 07/31/22 1851)  sodium chloride flush (NS) 0.9 % injection 3 mL (3 mLs Intravenous Given 07/31/22 2233)  acetaminophen (TYLENOL) tablet 650 mg (has no administration in time range)    Or  acetaminophen (TYLENOL) suppository 650 mg (has no administration in time range)  HYDROcodone-acetaminophen (NORCO/VICODIN) 5-325 MG per tablet 1-2 tablet (has no administration in time range)  HYDROmorphone (DILAUDID) injection 0.5-1 mg (has no administration in time range)  methocarbamol (ROBAXIN) 500 mg in dextrose 5 % 50 mL IVPB (0 mg Intravenous Stopped 07/31/22 2304)  polyethylene glycol (MIRALAX / GLYCOLAX) packet 17 g (has no administration in time range)  sorbitol 70 % solution 30 mL (has no administration in time range)  methocarbamol (ROBAXIN) 500 mg in  dextrose 5 % 50 mL IVPB (0 mg Intravenous Hold 07/31/22 2045)  ziprasidone (GEODON) capsule 60 mg (60 mg Oral Given 07/31/22 2231)  ziprasidone (GEODON) capsule 80 mg (80 mg Oral Given 07/31/22 2230)  oxyCODONE (Oxy IR/ROXICODONE) immediate release tablet 5 mg (5 mg Oral Given 07/31/22 1451)  fentaNYL (SUBLIMAZE) injection 100 mcg (100 mcg  Intravenous Given 07/31/22 1626)  acetaminophen (TYLENOL) tablet 1,000 mg (1,000 mg Oral Given 07/31/22 1850)  ketorolac (TORADOL) 15 MG/ML injection 15 mg (15 mg Intravenous Given 07/31/22 1851)    Mobility walks     Focused Assessments Musculoskeletal   R Recommendations: See Admitting Provider Note  Report given to:   Additional Notes: Fall risk

## 2022-08-01 NOTE — TOC Initial Note (Signed)
Transition of Care [facility]) - Initial/Assessment Note    Patient Details  Name: Kenneth Watts MRN: [id number] Date of Birth: 05-19-1946  Transition of Care [facility] CM/SW Contact:    Joanne Chars, LCSW Phone Number: 08/01/2022, 3:33 PM  Clinical Narrative:     CSW received call from [facility] after pt had called her facility multiple times about admission. CSW spoke with pt and discussed process of PT evaluation, which could lead to recommendation for SNF, at which time CSW would make referrals.  Pt verbalized understanding.  Pt lives with Sig other, no current services.  Permission given to speak with son Vedia Coffer.    Pt pending PT recommendations.  TOC will continue to follow.                Expected Discharge Plan:  (TBD) Barriers to Discharge: Continued Medical Work up, Other (must enter comment) (pending PT eval)   Patient Goals and CMS Choice Patient states their goals for this hospitalization and ongoing recovery are:: less back pain          Expected Discharge Plan and Services In-house Referral: Clinical Social Work   Post Acute Care Choice:  (TBD) Living arrangements for the past 2 months: Single Family Home                                      Prior Living Arrangements/Services Living arrangements for the past 2 months: Single Family Home Lives with:: Significant Other Patient language and need for interpreter reviewed:: Yes Do you feel safe going back to the place where you live?: Yes      Need for Family Participation in Patient Care: No (Comment) Care giver support system in place?: Yes (comment) Current home services: Other (comment) (none) Criminal Activity/Legal Involvement Pertinent to Current Situation/Hospitalization: No - Comment as needed  Activities of Daily Living      Permission Sought/Granted Permission sought to share information with : Family Supports Permission granted to share information with : Yes, Verbal  Permission Granted  Share Information with NAME: son Vedia Coffer           Emotional Assessment Appearance:: Appears stated age Attitude/Demeanor/Rapport: Engaged Affect (typically observed): Appropriate, Pleasant Orientation: : Oriented to Self, Oriented to Place, Oriented to  Time, Oriented to Situation      Admission diagnosis:  Thoracic spine fracture ([place]) [id number] Fall, initial encounter [W19.XXXA] Patient Active Problem List   Diagnosis Date Noted   Thoracic spine fracture ([place]) 07/31/2022   Primary localized osteoarthritis of left hip 01/19/2019   Hyperglycemia 05/10/2015   Dyspepsia 01/05/2015   Skin cancer 01/05/2015   Vitamin D deficiency 12/02/2014   Infectious mononucleosis 12/02/2014   Hyperlipidemia 12/01/2014   Insomnia 10/13/2013   Pain due to dental caries 09/29/2013   Nonspecific abnormal electrocardiogram (ECG) (EKG) 07/16/2013   Hypogonadism male 01/15/2013   Schizophrenia ([place]) 01/15/2013   OSA (obstructive sleep apnea) 01/15/2013   Gout 01/15/2013   Melanoma ([place]) 01/15/2013   HTN (hypertension) 01/15/2013   PCP:  [name], MD Pharmacy:   [facility], [place] - [address] [place] [address] Phone: [phone] Fax: [phone]     Social Determinants of Health (SDOH) Social History: SDOH Screenings   Tobacco Use: Medium Risk (07/31/2022)   SDOH Interventions:     Readmission Risk Interventions     No data to display

## 2022-08-01 NOTE — Progress Notes (Signed)
Patient ID: Kenneth Watts, male   DOB: 1945-07-04, 77 y.o.   MRN: [id number] BP (!) 106/57 (BP Location: Left Arm)   Pulse 73   Temp 98.2 F (36.8 C)   Resp 17   Ht 6' (1.829 m)   Wt 113.4 kg   SpO2 94%   BMI 33.91 kg/m  Alert and oriented x 4 Speech is clear, fluent Moving lower extremities well Continue bed rest

## 2022-08-01 NOTE — Plan of Care (Signed)

## 2022-08-01 NOTE — Consult Note (Addendum)
Reason for Consult:Right fibula fx Referring Physician: Marlowe Aschoff Dahal Time called: 0911 Time at bedside: Kenneth Watts is an 77 y.o. male.  HPI: Kenneth Watts has fallen twice in the recent past. He has been having significant back pain. He was seen by Dr. Mardelle Matte who did not think he could do anything for him. He then fell again and had severe back pain that prompted him to come to the ED. Workup showed a T9 fx and epidural hematoma as well as a right fibular head fx and orthopedic surgery was consulted for the latter.   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 He has a 19.00 pack-year smoking history. He has never used smokeless tobacco. He reports that he does not drink alcohol and does not use drugs.  Allergies:  Allergies  Allergen Reactions   Lisinopril Other (See Comments)    Cough    Medications: I have reviewed the patient's current  medications.  Results for orders placed or performed during the hospital encounter of 07/31/22 (from the past 48 hour(s))  CBC with Differential     Status: Abnormal   Collection Time: 07/31/22  4:30 PM  Result Value Ref Range   WBC 6.3 4.0 - 10.5 K/uL   RBC 4.90 4.22 - 5.81 MIL/uL   Hemoglobin 13.3 13.0 - 17.0 g/dL   HCT 43.9 39.0 - 52.0 %   MCV 89.6 80.0 - 100.0 fL   MCH 27.1 26.0 - 34.0 pg   MCHC 30.3 30.0 - 36.0 g/dL   RDW 19.1 (H) 11.5 - 15.5 %   Platelets 207 150 - 400 K/uL   nRBC 0.0 0.0 - 0.2 %   Neutrophils Relative % 71 %   Neutro Abs 4.5 1.7 - 7.7 K/uL   Lymphocytes Relative 19 %   Lymphs Abs 1.2 0.7 - 4.0 K/uL   Monocytes Relative 8 %   Monocytes Absolute 0.5 0.1 - 1.0 K/uL   Eosinophils Relative 1 %   Eosinophils Absolute 0.1 0.0 - 0.5 K/uL   Basophils Relative 1 %   Basophils Absolute 0.0 0.0 - 0.1 K/uL   Immature Granulocytes 0 %   Abs Immature Granulocytes 0.02 0.00 - 0.07 K/uL    Comment: Performed  at [facility], [place] [address] [place], [place] [id number]  Basic metabolic panel     Status: Abnormal   Collection Time: 07/31/22  4:30 PM  Result Value Ref Range   Sodium 140 135 - 145 mmol/L   Potassium 3.7 3.5 - 5.1 mmol/L   Chloride 105 98 - 111 mmol/L   CO2 24 22 - 32 mmol/L   Glucose, Bld 109 (H) 70 - 99 mg/dL    Comment: Glucose reference range applies only to samples taken after fasting for at least 8 hours.   BUN 14 8 - 23 mg/dL   Creatinine, Ser 0.92 0.61 - 1.24 mg/dL   Calcium 8.7 (L) 8.9 - 10.3 mg/dL   GFR, Estimated >60 >60 mL/min    Comment: (NOTE) Calculated using the CKD-EPI Creatinine Equation (2021)    Anion gap 11 5 - 15    Comment: Performed at [facility] [address] [place], [place] [address]  Comprehensive metabolic panel     Status: Abnormal   Collection Time: 08/01/22  5:58 AM  Result Value Ref Range   Sodium 140 135 - 145 mmol/L   Potassium 3.3 (L) 3.5 - 5.1 mmol/L   Chloride 105 98 - 111 mmol/L   CO2 26 22  - 32 mmol/L   Glucose, Bld 92 70 - 99 mg/dL    Comment: Glucose reference range applies only to samples taken after fasting for at least 8 hours.   BUN 14 8 - 23 mg/dL   Creatinine, Ser 0.88 0.61 - 1.24 mg/dL   Calcium 8.4 (L) 8.9 - 10.3 mg/dL   Total Protein 5.6 (L) 6.5 - 8.1 g/dL   Albumin 3.0 (L) 3.5 - 5.0 g/dL   AST 40 15 - 41 U/L   ALT 31 0 - 44 U/L   Alkaline Phosphatase 49 38 - 126 U/L   Total Bilirubin 0.3 0.3 - 1.2 mg/dL   GFR, Estimated >60 >60 mL/min    Comment: (NOTE) Calculated using the CKD-EPI Creatinine Equation (2021)    Anion gap 9 5 - 15    Comment: Performed at [facility], [place] [address], [place], [place] [address]  CBC     Status: Abnormal   Collection Time: 08/01/22  5:58 AM  Result Value Ref Range   WBC 5.4 4.0 - 10.5 K/uL   RBC 4.38 4.22 - 5.81 MIL/uL   Hemoglobin 12.0 (L) 13.0 - 17.0 g/dL   HCT 39.3 39.0 - 52.0 %   MCV 89.7 80.0 - 100.0 fL   MCH 27.4 26.0 - 34.0 pg   MCHC 30.5 30.0 - 36.0 g/dL   RDW 19.0 (H) 11.5 - 15.5 %   Platelets 169 150 - 400 K/uL   nRBC 0.0 0.0 - 0.2 %    Comment: Performed at [facility], [place] [address], [place], [place] [address]    MR THORACIC SPINE WO CONTRAST  Addendum Date: 07/31/2022   ADDENDUM REPORT: 07/31/2022 19:29 ADDENDUM: Critical Value/emergent results were called by telephone at the time of interpretation on 07/31/2022 at 7:25 pm to provider Dr. Trilby Drummer, who verbally acknowledged these results. Electronically Signed   By: Emmit Alexanders M.D.   On: 07/31/2022 19:29   Result Date: 07/31/2022 CLINICAL DATA:  Mid and low back pain, neuro deficit.  Trauma. EXAM: MRI THORACIC AND LUMBAR SPINE WITHOUT CONTRAST TECHNIQUE: Multiplanar and multiecho pulse sequences of the thoracic and lumbar spine were obtained without intravenous contrast. COMPARISON:  CT  thoracic and lumbar spine 07/31/2022. FINDINGS: MRI THORACIC SPINE FINDINGS Alignment:  Normal. Vertebrae: Redemonstrated fracture through the T9-10 disc  space with extension into the posterior elements at T9. Associated mild marrow edema in the posterior aspect of the T10 superior endplate without extension into the posterior elements. Cord: Dorsal epidural hematoma centered at T8-9, extending from T7 to T10, with compression of the spinal cord at the T10-11 level. No definite spinal cord edema. Paraspinal and other soft tissues: Mild bilateral paravertebral edema and hematoma centered around the T9-10 level. Redemonstration of small bilateral pleural effusions. Disc levels: No significant degenerative change. MRI LUMBAR SPINE FINDINGS Segmentation:  Standard. Alignment:  Physiologic. Vertebrae:  No fracture, evidence of discitis, or bone lesion. Conus medullaris and cauda equina: Conus extends to the L1 level. Conus and cauda equina appear normal. Paraspinal and other soft tissues: Unremarkable. Disc levels: Mild lower lumbar facet arthropathy without significant spinal canal stenosis or neural foraminal narrowing. IMPRESSION: 1. Confirmed dorsal epidural hematoma centered at T8-9, extending from T7 to T10, with compression of the spinal cord at the T10-11 level. No definite spinal cord edema. 2. Redemonstrated fracture through the T9-10 disc space with extension into the posterior elements at T9. Associated mild marrow edema in the posterior aspect of the T10 superior endplate without extension into the posterior elements. Radiology assistant personnel have been notified to put me in telephone contact with the referring physician or the referring physician's clinical representative in order to discuss these findings. Once this communication is established I will issue an addendum to this report for documentation purposes. Electronically Signed: By: Emmit Alexanders M.D. On: 07/31/2022 19:15   MR LUMBAR SPINE WO CONTRAST  Addendum Date: 07/31/2022   ADDENDUM REPORT: 07/31/2022 19:29 ADDENDUM: Critical Value/emergent results were called by telephone at the time  of interpretation on 07/31/2022 at 7:25 pm to provider Dr. Trilby Drummer, who verbally acknowledged these results. Electronically Signed   By: Emmit Alexanders M.D.   On: 07/31/2022 19:29   Result Date: 07/31/2022 CLINICAL DATA:  Mid and low back pain, neuro deficit.  Trauma. EXAM: MRI THORACIC AND LUMBAR SPINE WITHOUT CONTRAST TECHNIQUE: Multiplanar and multiecho pulse sequences of the thoracic and lumbar spine were obtained without intravenous contrast. COMPARISON:  CT thoracic and lumbar spine 07/31/2022. FINDINGS: MRI THORACIC SPINE FINDINGS Alignment:  Normal. Vertebrae: Redemonstrated fracture through the T9-10 disc space with extension into the posterior elements at T9. Associated mild marrow edema in the posterior aspect of the T10 superior endplate without extension into the posterior elements. Cord: Dorsal epidural hematoma centered at T8-9, extending from T7 to T10, with compression of the spinal cord at the T10-11 level. No definite spinal cord edema. Paraspinal and other soft tissues: Mild bilateral paravertebral edema and hematoma centered around the T9-10 level. Redemonstration of small bilateral pleural effusions. Disc levels: No significant degenerative change. MRI LUMBAR SPINE FINDINGS Segmentation:  Standard. Alignment:  Physiologic. Vertebrae:  No fracture, evidence of discitis, or bone lesion. Conus medullaris and cauda equina: Conus extends to the L1 level. Conus and cauda equina appear normal. Paraspinal and other soft tissues: Unremarkable. Disc levels: Mild lower lumbar facet arthropathy without significant spinal canal stenosis or neural foraminal narrowing. IMPRESSION: 1. Confirmed dorsal epidural hematoma centered at T8-9, extending from T7 to T10, with compression of the spinal cord at the T10-11 level. No definite spinal cord edema. 2. Redemonstrated fracture through the T9-10 disc space with extension into the posterior elements at T9. Associated mild marrow edema in the posterior aspect  of  the T10 superior endplate without extension into the posterior elements. Radiology assistant personnel have been notified to put me in telephone contact with the referring physician or the referring physician's clinical representative in order to discuss these findings. Once this communication is established I will issue an addendum to this report for documentation purposes. Electronically Signed: By: Emmit Alexanders M.D. On: 07/31/2022 19:15   CT Lumbar Spine Wo Contrast  Result Date: 07/31/2022 CLINICAL DATA:  Back trauma.  Pain. EXAM: CT THORACIC AND LUMBAR SPINE WITHOUT CONTRAST TECHNIQUE: Multidetector CT imaging of the thoracic and lumbar spine was performed without contrast. Multiplanar CT image reconstructions were also generated. RADIATION DOSE REDUCTION: This exam was performed according to the departmental dose-optimization program which includes automated exposure control, adjustment of the mA and/or kV according to patient size and/or use of iterative reconstruction technique. COMPARISON:  None Available. FINDINGS: CT THORACIC SPINE FINDINGS Alignment: Normal. Vertebrae: Contiguous anterior syndesmophytes and ossification of the supraspinous ligament, suggestive of ankylosing spondylitis. Fracture through the T9-10 disc space extending into the posterior aspect of the inferior endplate of T9, through the posterior elements of T9 bilaterally (sagittal images 24 and 34 series 9). Findings suggestive of possible epidural hematoma in the dorsal soft tissues at the level of the fracture with moderate mass effect on the thecal sac (axial image 104 series 4). Paraspinal and other soft tissues: Dilated, fluid-filled esophagus. Small bilateral pleural effusions. Disc levels: No significant degenerative change. CT LUMBAR SPINE FINDINGS Segmentation: Conventional numbering is assumed with 5 non-rib-bearing, lumbar type vertebral bodies. Alignment: Normal. Vertebrae: Contiguous anterior syndesmophytes continue  through the L2 level with ossification of the supraspinous ligament continuing through the L1 level. No acute fracture of the lumbar spine. Paraspinal and other soft tissues: Atherosclerotic calcifications of the abdominal aorta and its branches. Disc levels: No significant degenerative change. IMPRESSION: 1. Acute fracture through the T9-10 disc space extending into the posterior aspect of the inferior endplate of T9, through the posterior elements of T9 bilaterally. 2. Findings suggestive of possible epidural hematoma in the dorsal soft tissues at the level of the fracture with moderate mass effect on the thecal sac. 3. Contiguous anterior syndesmophytes and ossification of the supraspinous ligament, suggestive of underlying ankylosing spondylitis. 4. No acute fracture of the lumbar spine. 5. Small bilateral pleural effusions. 6. Dilated, fluid-filled esophagus. 7. Aortic Atherosclerosis (ICD10-I70.0). Critical Value/emergent results were called by telephone at the time of interpretation on 07/31/2022 at 3:50 pm to provider RILEY RANSOM , who verbally acknowledged these results. Electronically Signed   By: Emmit Alexanders M.D.   On: 07/31/2022 15:56   CT Thoracic Spine Wo Contrast  Result Date: 07/31/2022 CLINICAL DATA:  Back trauma.  Pain. EXAM: CT THORACIC AND LUMBAR SPINE WITHOUT CONTRAST TECHNIQUE: Multidetector CT imaging of the thoracic and lumbar spine was performed without contrast. Multiplanar CT image reconstructions were also generated. RADIATION DOSE REDUCTION: This exam was performed according to the departmental dose-optimization program which includes automated exposure control, adjustment of the mA and/or kV according to patient size and/or use of iterative reconstruction technique. COMPARISON:  None Available. FINDINGS: CT THORACIC SPINE FINDINGS Alignment: Normal. Vertebrae: Contiguous anterior syndesmophytes and ossification of the supraspinous ligament, suggestive of ankylosing spondylitis.  Fracture through the T9-10 disc space extending into the posterior aspect of the inferior endplate of T9, through the posterior elements of T9 bilaterally (sagittal images 24 and 34 series 9). Findings suggestive of possible epidural hematoma in the dorsal soft tissues at the level of  the fracture with moderate mass effect on the thecal sac (axial image 104 series 4). Paraspinal and other soft tissues: Dilated, fluid-filled esophagus. Small bilateral pleural effusions. Disc levels: No significant degenerative change. CT LUMBAR SPINE FINDINGS Segmentation: Conventional numbering is assumed with 5 non-rib-bearing, lumbar type vertebral bodies. Alignment: Normal. Vertebrae: Contiguous anterior syndesmophytes continue through the L2 level with ossification of the supraspinous ligament continuing through the L1 level. No acute fracture of the lumbar spine. Paraspinal and other soft tissues: Atherosclerotic calcifications of the abdominal aorta and its branches. Disc levels: No significant degenerative change. IMPRESSION: 1. Acute fracture through the T9-10 disc space extending into the posterior aspect of the inferior endplate of T9, through the posterior elements of T9 bilaterally. 2. Findings suggestive of possible epidural hematoma in the dorsal soft tissues at the level of the fracture with moderate mass effect on the thecal sac. 3. Contiguous anterior syndesmophytes and ossification of the supraspinous ligament, suggestive of underlying ankylosing spondylitis. 4. No acute fracture of the lumbar spine. 5. Small bilateral pleural effusions. 6. Dilated, fluid-filled esophagus. 7. Aortic Atherosclerosis (ICD10-I70.0). Critical Value/emergent results were called by telephone at the time of interpretation on 07/31/2022 at 3:50 pm to provider RILEY RANSOM , who verbally acknowledged these results. Electronically Signed   By: Emmit Alexanders M.D.   On: 07/31/2022 15:56   DG Knee Complete 4 Views Right  Result Date:  07/31/2022 CLINICAL DATA:  Fall EXAM: RIGHT KNEE - COMPLETE 4+ VIEW COMPARISON:  None Available. FINDINGS: There is an acute comminuted nondisplaced fracture of the proximal fibular diaphysis. There is no dislocation. There is overlying soft tissue swelling. No significant joint effusion. IMPRESSION: Acute comminuted nondisplaced fracture of the proximal fibular diaphysis. Electronically Signed   By: Ronney Asters M.D.   On: 07/31/2022 15:24    Review of Systems  HENT:  Negative for ear discharge, ear pain, hearing loss and tinnitus.   Eyes:  Negative for photophobia and pain.  Respiratory:  Negative for cough and shortness of breath.   Cardiovascular:  Negative for chest pain.  Gastrointestinal:  Negative for abdominal pain, nausea and vomiting.  Genitourinary:  Negative for dysuria, flank pain, frequency and urgency.  Musculoskeletal:  Positive for arthralgias (Right knee) and back pain. Negative for myalgias and neck pain.  Neurological:  Negative for dizziness and headaches.  Hematological:  Does not bruise/bleed easily.  Psychiatric/Behavioral:  The patient is not nervous/anxious.    Blood pressure (!) 106/55, pulse 81, temperature 97.9 F (36.6 C), resp. rate 18, height 6' (1.829 m), weight 113.4 kg, SpO2 97 %. Physical Exam Constitutional:      General: He is not in acute distress.    Appearance: He is well-developed. He is not diaphoretic.  HENT:     Head: Normocephalic and atraumatic.  Eyes:     General: No scleral icterus.       Right eye: No discharge.        Left eye: No discharge.     Conjunctiva/sclera: Conjunctivae normal.  Cardiovascular:     Rate and Rhythm: Normal rate and regular rhythm.  Pulmonary:     Effort: Pulmonary effort is normal. No respiratory distress.  Musculoskeletal:     Cervical back: Normal range of motion.     Comments: RLE No traumatic wounds, ecchymosis, or rash  Point TTP over fibular head  No knee or ankle effusion  Knee stable to varus/  valgus and anterior/posterior stress  Sens DPN, SPN, TN intact  Motor EHL, ext, flex,  evers 5/5  DP 1+, PT 0, No significant edema  Skin:    General: Skin is warm and dry.  Neurological:     Mental Status: He is alert.  Psychiatric:        Mood and Affect: Mood normal.        Behavior: Behavior normal.     Assessment/Plan: Right fibula fx -- Will check ankle film. May be WBAT RLE. No bracing required. F/u with Dr. Mardelle Matte in 2-3 weeks.    Lisette Abu, PA-C Orthopedic Surgery [phone] 08/01/2022, 9:37 AM

## 2022-08-02 DIAGNOSIS — S22079A Unspecified fracture of T9-T10 vertebra, initial encounter for closed fracture: Secondary | ICD-10-CM | POA: Diagnosis not present

## 2022-08-02 MED ORDER — FERROUS SULFATE 325 (65 FE) MG PO TABS
325.0000 mg | ORAL_TABLET | Freq: Every day | ORAL | Status: DC
  Administered 2022-08-02 – 2022-08-07 (×6): 325 mg via ORAL
  Filled 2022-08-02 (×6): qty 1

## 2022-08-02 MED ORDER — ALLOPURINOL 100 MG PO TABS
100.0000 mg | ORAL_TABLET | Freq: Every day | ORAL | Status: DC
  Administered 2022-08-02 – 2022-08-07 (×6): 100 mg via ORAL
  Filled 2022-08-02 (×6): qty 1

## 2022-08-02 NOTE — Progress Notes (Signed)
PT Cancellation Note  Patient Details Name: Kenneth Watts MRN: [id number] DOB: 12-06-1945   Cancelled Treatment:    Reason Eval/Treat Not Completed: Patient not medically ready  Neurosurgery notes recommend patient remain on bed rest, no order in place yet, attending notified. Will hold formal PT evaluation at this time.    Candie Mile, PT, DPT Physical Therapist Acute Rehabilitation Services [place] [facility]  08/02/2022, 8:05 AM

## 2022-08-02 NOTE — Progress Notes (Signed)
OT Cancellation Note  Patient Details Name: Kenneth Watts MRN: [id number] DOB: 11/23/1945   Cancelled Treatment:    Reason Eval/Treat Not Completed: Patient not medically ready. Per neuro notes, pt is to continue bed rest; not order in place yet. Have reached out.   Elder Cyphers, OTR/L [facility] Acute Rehabilitation Office: [phone]   Magnus Ivan 08/02/2022, 10:26 AM

## 2022-08-02 NOTE — Progress Notes (Signed)
Patient ID: Kenneth Watts, male   DOB: Aug 04, 1945, 77 y.o.   MRN: [id number] BP (!) 103/51 (BP Location: Left Arm)   Pulse 65   Temp 97.7 F (36.5 C) (Oral)   Resp 20   Ht 6' (1.829 m)   Wt 113.4 kg   SpO2 94%   BMI 33.91 kg/m  Alert and oriented x 4, speech is clear, fluent Moving lower extremities well Bedrest until Monday AM

## 2022-08-02 NOTE — Care Management Important Message (Signed)
Important Message  Patient Details  Name: Kenneth Watts MRN: [id number] Date of Birth: [birth year]   Medicare Important Message Given:  Yes     Hannah Beat 08/02/2022, 11:38 AM

## 2022-08-02 NOTE — Progress Notes (Addendum)
PROGRESS NOTE  Kenneth Watts  DOB: [birth year]  PCP: [name], MD [id number]  DOA: 07/31/2022  LOS: 2 days  Hospital Day: 3  Brief narrative: Kenneth Watts is a 77 y.o. male with PMH significant for obesity, HTN, HLD, OSA on CPAP and nightly O2, COPD, gout, dyspepsia, schizophrenia, anxiety, diverticulosis. 3/13, patient presented to the ED with complaint of severe back pain after recent falls. Patient reported 2 falls in the past week.  He feels he is legs are weak and give out while walking. He saw orthopedics Dr. Mardelle Matte a few days ago.  X-rays were done showing some compression fractures.  Most recent fall on 3/12 caused severe pain and difficulty walking and hence presented to the ED.  In the ED, afebrile, breathing on room air, heart rate in 60s blood pressure in 140s Initial labs with unremarkable CBC, BMP X-ray right knee showed acute comminuted nondisplaced fracture of the proximal fibular diaphysis. CT thoracic and lumbar spine showed 1. Acute fracture through the T9-10 disc space extending into the posterior aspect of the inferior endplate of T9, through the posterior elements of T9 bilaterally. 2. Findings suggestive of possible epidural hematoma in the dorsal soft tissues at the level of the fracture with moderate mass effect on the thecal sac. 3. Contiguous anterior syndesmophytes and ossification of the supraspinous ligament, suggestive of underlying ankylosing spondylitis. 4. No acute fracture of the lumbar spine. MRI thoracic and lumbar spine confirmed the finding noted earlier in the CT scan.    Patient was seen by neurosurgeon Dr. Christella Noa  3/15: Pain seems well-controlled.  Different messages regarding bedrest across the board, tried contacting neurosurgery for final recommendations as patient is now getting ready to work with PT, TLSO brace in the room. Message left with nursing staff at neurosurgery office to place clear recommendations or let us  know.  Addendum.  Neurosurgery eventually replied and patient will be bedrest at least throughout the weekend.  Subjective: Patient was seen and examined today.  Pain seems well-controlled with current regimen.  Patient was asking about when to get out of bed.  He was also requesting to restart home allopurinol and iron supplement.  Assessment and plan: Acute T9 fracture Epidural hematoma Secondary to several recent falls CT/MRI thoracic and lumbar spine as above. Neurosurgeon Dr. Christella Noa saw the patient Per neurosurgery note, no neurological deficit at this time given patient's ability to move both legs and lack of urinary or bowel incontinence. No surgical intervention recommended at this time. Continue to monitor.  Avoid chemical DVT prophylaxis given epidural hematoma. PT/OT eval ordered-but neurosurgery is putting continue bedrest and that note with no clear recommendations.  Another message left with his nurse.   -Continue with pain management   Acute comminuted nondisplaced fracture of the proximal fibular diaphysis Seen by orthopedics.  Ankle x-ray looks okay and they were recommending weightbearing as tolerated.  falls  impaired mobility Getting to patient's son, patient was less active for the last few months after hospitalization for GI bleed and iron deficiency anemia.  Just recently started to be more active but he still had not recovered.   He fell twice in last 1 week.  No other neurological deficits noted on exam. PT eval pending.  If loss of balance noted, may consider brain imaging.  Schizophrenia Anxiety Continue home Geodon   OSA Continue home CPAP with nocturnal oxygen  Dilated, fluid-filled esophagus Noted in CT thoracic spine No symptoms of reflux, gastritis, dysphagia. Follow-up as an  outpatient  Aortic Atherosclerosis In his med list, I do not see any antiplatelet or statin.  Definitely not starting blood thinner at this time given epidural hematoma.   Please follow-up with PCP.   Mobility: PT eval pending  Goals of care   Code Status: Full Code     DVT prophylaxis:  SCDs Start: 07/31/22 1843   Antimicrobials: None Fluid: None Consultants: Orthopedics, neurosurgery Family Communication:   Status: Inpatient Level of care:  Telemetry Surgical   Needs to continue in-hospital care:  Needs adequate pain control, mobility.  May need to go to SNF/CIR  Prognosis:  Reports good functional status until recently.  If he recovers well from this back injury, prognosis is good  Patient from: Home Anticipated d/c to: Pending pain control and PT recommendation     Scheduled Meds:  acetaminophen  1,000 mg Oral TID   allopurinol  100 mg Oral Daily   ferrous sulfate  325 mg Oral Q breakfast   sodium chloride flush  3 mL Intravenous Q12H   ziprasidone  60 mg Oral QPM   ziprasidone  80 mg Oral QPM    PRN meds: HYDROmorphone (DILAUDID) injection, methocarbamol (ROBAXIN) IV, oxyCODONE, polyethylene glycol, sorbitol   Infusions:   methocarbamol (ROBAXIN) IV Stopped (07/31/22 2304)    Diet:  Diet Order             Diet regular Room service appropriate? Yes; Fluid consistency: Thin  Diet effective now                   Antimicrobials: Anti-infectives (From admission, onward)    None       Nutritional status:  Body mass index is 33.91 kg/m.          Objective: Vitals:   08/02/22 0729 08/02/22 1321  BP: (!) 147/75 124/61  Pulse: 70 69  Resp: 17 17  Temp: 97.6 F (36.4 C) 98.5 F (36.9 C)  SpO2: 96% 95%    Intake/Output Summary (Last 24 hours) at 08/02/2022 1352 Last data filed at 08/02/2022 0547 Gross per 24 hour  Intake --  Output 800 ml  Net -800 ml    Filed Weights   07/31/22 1340  Weight: 113.4 kg   Weight change:  Body mass index is 33.91 kg/m.   Physical Exam: General.  Obese gentleman, in no acute distress. Pulmonary.  Lungs clear bilaterally, normal respiratory effort. CV.   Regular rate and rhythm, no JVD, rub or murmur. Abdomen.  Soft, nontender, nondistended, BS positive. CNS.  Alert and oriented .  No focal neurologic deficit. Extremities.  No edema, no cyanosis, pulses intact and symmetrical. Psychiatry.  Judgment and insight appears normal.   Data Review: I have personally reviewed the laboratory data and studies available.  F/u labs ordered. Unresulted Labs (From admission, onward)    None       Total time spent in review of labs and imaging, patient evaluation, formulation of plan, documentation and communication with family: 43 minutes  Signed, Lorella Nimrod, MD [facility] 08/02/2022

## 2022-08-03 DIAGNOSIS — S22079A Unspecified fracture of T9-T10 vertebra, initial encounter for closed fracture: Secondary | ICD-10-CM | POA: Diagnosis not present

## 2022-08-03 NOTE — Plan of Care (Signed)
  Problem: Coping: Goal: Level of anxiety will decrease Outcome: Progressing   Problem: Pain Managment: Goal: General experience of comfort will improve Outcome: Progressing   Problem: Skin Integrity: Goal: Risk for impaired skin integrity will decrease Outcome: Progressing   

## 2022-08-03 NOTE — Progress Notes (Signed)
PROGRESS NOTE  Kenneth Watts  DOB: 1946-03-15  PCP: [name], MD [id number]  DOA: 07/31/2022  LOS: 3 days  Hospital Day: 4  Brief narrative: Kenneth Watts is a 77 y.o. male with PMH significant for obesity, HTN, HLD, OSA on CPAP and nightly O2, COPD, gout, dyspepsia, schizophrenia, anxiety, diverticulosis. 3/13, patient presented to the ED with complaint of severe back pain after recent falls. Patient reported 2 falls in the past week.  He feels he is legs are weak and give out while walking. He saw orthopedics Dr. Mardelle Matte a few days ago.  X-rays were done showing some compression fractures.  Most recent fall on 3/12 caused severe pain and difficulty walking and hence presented to the ED.  In the ED, afebrile, breathing on room air, heart rate in 60s blood pressure in 140s Initial labs with unremarkable CBC, BMP X-ray right knee showed acute comminuted nondisplaced fracture of the proximal fibular diaphysis. CT thoracic and lumbar spine showed 1. Acute fracture through the T9-10 disc space extending into the posterior aspect of the inferior endplate of T9, through the posterior elements of T9 bilaterally. 2. Findings suggestive of possible epidural hematoma in the dorsal soft tissues at the level of the fracture with moderate mass effect on the thecal sac. 3. Contiguous anterior syndesmophytes and ossification of the supraspinous ligament, suggestive of underlying ankylosing spondylitis. 4. No acute fracture of the lumbar spine. MRI thoracic and lumbar spine confirmed the finding noted earlier in the CT scan.    Patient was seen by neurosurgeon Dr. Christella Noa  3/15: Pain seems well-controlled.  Different messages regarding bedrest across the board, tried contacting neurosurgery for final recommendations as patient is now getting ready to work with PT, TLSO brace in the room. Message left with nursing staff at neurosurgery office to place clear recommendations or let us  know.  3/16: Having some back pain.  Neurosurgery would like to continue bedrest over the weekend and they will decide about whether to ambulate or take him to the OR to stabilize fractures tomorrow.  Subjective: Patient was having 5-6/10 pain while resting.  No focal deficit or incontinence.  Assessment and plan: Acute T9 fracture Epidural hematoma Secondary to several recent falls CT/MRI thoracic and lumbar spine as above. Neurosurgeon Dr. Christella Noa saw the patient Per neurosurgery note, no neurological deficit at this time given patient's ability to move both legs and lack of urinary or bowel incontinence. No surgical intervention recommended at this time. Continue to monitor.  Avoid chemical DVT prophylaxis given epidural hematoma. PT/OT eval ordered-but neurosurgery is recommending continuation of bedrest until tomorrow and then they will decide whether to ambulate or take him to the OR. -Continue with pain management   Acute comminuted nondisplaced fracture of the proximal fibular diaphysis Seen by orthopedics.  Ankle x-ray looks okay and they were recommending weightbearing as tolerated.  falls  impaired mobility Getting to patient's son, patient was less active for the last few months after hospitalization for GI bleed and iron deficiency anemia.  Just recently started to be more active but he still had not recovered.   He fell twice in last 1 week.  No other neurological deficits noted on exam. PT eval pending-patient still at bedrest  Schizophrenia Anxiety Continue home Geodon   OSA Continue home CPAP with nocturnal oxygen  Dilated, fluid-filled esophagus Noted in CT thoracic spine No symptoms of reflux, gastritis, dysphagia. Follow-up as an outpatient  Aortic Atherosclerosis In his med list, I do not  see any antiplatelet or statin.  Definitely not starting blood thinner at this time given epidural hematoma.  Please follow-up with PCP.   Mobility: PT eval  pending  Goals of care   Code Status: Full Code     DVT prophylaxis:  SCDs Start: 07/31/22 1843   Antimicrobials: None Fluid: None Consultants: Orthopedics, neurosurgery Family Communication:   Status: Inpatient Level of care:  Med-Surg   Needs to continue in-hospital care:  Needs adequate pain control, mobility.  May need to go to SNF/CIR  Prognosis:  Reports good functional status until recently.  If he recovers well from this back injury, prognosis is good  Patient from: Home Anticipated d/c to: Pending pain control and PT recommendation     Scheduled Meds:  acetaminophen  1,000 mg Oral TID   allopurinol  100 mg Oral Daily   ferrous sulfate  325 mg Oral Q breakfast   sodium chloride flush  3 mL Intravenous Q12H   ziprasidone  60 mg Oral QPM   ziprasidone  80 mg Oral QPM    PRN meds: HYDROmorphone (DILAUDID) injection, methocarbamol (ROBAXIN) IV, oxyCODONE, polyethylene glycol, sorbitol   Infusions:   methocarbamol (ROBAXIN) IV Stopped (07/31/22 2304)    Diet:  Diet Order             Diet regular Room service appropriate? Yes; Fluid consistency: Thin  Diet effective now                   Antimicrobials: Anti-infectives (From admission, onward)    None       Nutritional status:  Body mass index is 33.91 kg/m.          Objective: Vitals:   08/03/22 0354 08/03/22 0754  BP: (!) 155/69 (!) 152/62  Pulse: (!) 58 70  Resp: 18 18  Temp:  98 F (36.7 C)  SpO2: 97% 99%    Intake/Output Summary (Last 24 hours) at 08/03/2022 1038 Last data filed at 08/03/2022 0700 Gross per 24 hour  Intake 240 ml  Output --  Net 240 ml    Filed Weights   07/31/22 1340  Weight: 113.4 kg   Weight change:  Body mass index is 33.91 kg/m.   Physical Exam: General.  Obese gentleman, in no acute distress. Pulmonary.  Lungs clear bilaterally, normal respiratory effort. CV.  Regular rate and rhythm, no JVD, rub or murmur. Abdomen.  Soft, nontender,  nondistended, BS positive. CNS.  Alert and oriented .  No focal neurologic deficit. Extremities.  No edema, no cyanosis, pulses intact and symmetrical. Psychiatry.  Judgment and insight appears normal.   Data Review: I have personally reviewed the laboratory data and studies available.  F/u labs ordered. Unresulted Labs (From admission, onward)    None       Total time spent in review of labs and imaging, patient evaluation, formulation of plan, documentation and communication with family: 29 minutes  Signed, Lorella Nimrod, MD [facility] 08/03/2022

## 2022-08-03 NOTE — TOC CAGE-AID Note (Signed)
Transition of Care [facility]) - CAGE-AID Screening   Patient Details  Name: Kenneth Watts MRN: [id number] Date of Birth: 11/15/1945   Elvina Sidle, RN Trauma Response Nurse Phone Number: [phone] 08/03/2022, 5:52 PM       CAGE-AID Screening:    Have You Ever Felt You Ought to Cut Down on Your Drinking or Drug Use?: No Have People Annoyed You By Critizing Your Drinking Or Drug Use?: No Have You Felt Bad Or Guilty About Your Drinking Or Drug Use?: No Have You Ever Had a Drink or Used Drugs First Thing In The Morning to Steady Your Nerves or to Get Rid of a Hangover?: No CAGE-AID Score: 0  Substance Abuse Education Offered: No (pt states he quit drinking 35 years ago)

## 2022-08-03 NOTE — Progress Notes (Signed)
PT Cancellation Note  Patient Details Name: Kenneth Watts MRN: [id number] DOB: 1946-03-25   Cancelled Treatment:    Reason Eval/Treat Not Completed: Patient not medically ready  Per neurosurgery, patient to remain on bed rest until Monday AM. Patient notified. Will hold formal PT evaluation until then.  Candie Mile, PT, DPT Physical Therapist [facility]  08/03/2022, 7:33 AM

## 2022-08-03 NOTE — Progress Notes (Signed)
Pt placed on bipap via dream station to rest. Pt tolerating well with stable vitals.

## 2022-08-03 NOTE — Progress Notes (Signed)
Subjective: Patient reports patient with no lower extremity symptoms condition of back pain manageable  Objective: Vital signs in last 24 hours: Temp:  [97.7 F (36.5 C)-98.5 F (36.9 C)] 98 F (36.7 C) (03/16 0754) Pulse Rate:  [58-70] 70 (03/16 0754) Resp:  [17-20] 18 (03/16 0754) BP: (103-155)/(51-69) 152/62 (03/16 0754) SpO2:  [94 %-99 %] 99 % (03/16 0754)  Intake/Output from previous day: 03/15 0701 - 03/16 0700 In: 240 [P.O.:240] Out: -  Intake/Output this shift: No intake/output data recorded.  Strength 5 out of 5  Lab Results: Recent Labs    07/31/22 1630 08/01/22 0558  WBC 6.3 5.4  HGB 13.3 12.0*  HCT 43.9 39.3  PLT 207 169   BMET Recent Labs    07/31/22 1630 08/01/22 0558  NA 140 140  K 3.7 3.3*  CL 105 105  CO2 24 26  GLUCOSE 109* 92  BUN 14 14  CREATININE 0.92 0.88  CALCIUM 8.7* 8.4*    Studies/Results: DG Ankle Complete Right  Result Date: 08/01/2022 CLINICAL DATA:  Ankle pain. EXAM: RIGHT ANKLE - COMPLETE 3+ VIEW COMPARISON:  None Available. FINDINGS: The ankle mortise is maintained. No significant degenerative changes or obvious joint effusion. No acute ankle fracture is identified. Band of increased density across the distal fibular shaft could be an old healed fracture. No osteochondral lesion. On the lateral film there is a sclerotic bony density noted off the dorsal aspect of the distal talus. This has the appearance of an old avulsion fracture. The visualized mid and hindfoot bony structures are intact. IMPRESSION: 1. No acute bony findings or significant degenerative changes. 2. Probable old avulsion fracture off the dorsal aspect of the distal talus. 3. Possible old healed distal fibular shaft fracture. Electronically Signed   By: Marijo Sanes M.D.   On: 08/01/2022 11:26    Assessment/Plan: T9-10 Chance fracture patient on bedrest over the weekend.  Plan of either mobilization or operative stabilization per Dr. Christella Noa on Monday  LOS: 3  days     Elaina Hoops 08/03/2022, 9:05 AM

## 2022-08-04 DIAGNOSIS — S22079A Unspecified fracture of T9-T10 vertebra, initial encounter for closed fracture: Secondary | ICD-10-CM | POA: Diagnosis not present

## 2022-08-04 NOTE — Hospital Course (Signed)
77 y.o.m w/ obesity, HTN, HLD, OSA on CPAP and nightly O2, COPD, gout, dyspepsia, schizophrenia, anxiety, diverticulosis on 3/13-presented to the ED with complaint of severe back pain after recent 2 falls in the past week and feels legs are weak and give out while walking. He saw orthopedics Dr. Mardelle Matte a few days ago.  X-rays were done showing some compression fractures.  Most recent fall on 3/12 caused severe pain and difficulty walking and hence presented to the ED. In the [id number] labs with unremarkable CBC, BMP. X-ray right knee showed acute comminuted nondisplaced fracture of the proximal fibular diaphysis.CT thoracic and lumbar spine showed: Acute fracture through T9-10 disc space extending into the posterior aspect of the inferior endplate of T9, through the posterior elements of T9 bilaterally.Findings suggestive of possible epidural hematoma in the dorsal soft tissues at the level of the fracture with moderate mass effect on the thecal sac. possible underlying ankylosing spondylitis. MRI thoracic and lumbar spine confirmed the finding noted earlier in the CT scan.   Patient was seen by neurosurgeon Dr. Christella Noa and has been placed on pain management, bedrest and neurosurgery evaluating following closely for further recommendation.

## 2022-08-04 NOTE — Progress Notes (Signed)
PROGRESS NOTE Kenneth Watts  [id number] DOB: [birth year] DOA: 07/31/2022 PCP: [name], MD  Brief Narrative/Hospital Course: 77 y.o.m w/ obesity, HTN, HLD, OSA on CPAP and nightly O2, COPD, gout, dyspepsia, schizophrenia, anxiety, diverticulosis on 3/13-presented to the ED with complaint of severe back pain after recent 2 falls in the past week and feels legs are weak and give out while walking. He saw orthopedics Dr. Mardelle Matte a few days ago.  X-rays were done showing some compression fractures.  Most recent fall on 3/12 caused severe pain and difficulty walking and hence presented to the ED. In the [id number] labs with unremarkable CBC, BMP. X-ray right knee showed acute comminuted nondisplaced fracture of the proximal fibular diaphysis.CT thoracic and lumbar spine showed: Acute fracture through T9-10 disc space extending into the posterior aspect of the inferior endplate of T9, through the posterior elements of T9 bilaterally.Findings suggestive of possible epidural hematoma in the dorsal soft tissues at the level of the fracture with moderate mass effect on the thecal sac. possible underlying ankylosing spondylitis. MRI thoracic and lumbar spine confirmed the finding noted earlier in the CT scan.   Patient was seen by neurosurgeon Dr. Christella Noa and has been placed on pain management, bedrest and neurosurgery evaluating following closely for further recommendation.    Subjective: Seen and examined, Overnight vitals/labs/events reviewed  Overnight patient has been afebrile Denies any weakness in lower extremities or numbness, back pain is controlled with medication.  Assessment and Plan: Principal Problem:   Thoracic spine fracture ([place]) Active Problems:   Schizophrenia (HCC)   OSA (obstructive sleep apnea)   Gout   HTN (hypertension)   Hyperlipidemia  Acute T9 fracture with epidural hematoma secondary to fall: CT MRI thoracic and lumbar spine reviewed as above neurosurgery  following closely, continue pain management, pain is usually controlled.  Avoiding chemical DVT prophylaxis due to epidural hematoma.  Continue bedrest for now awaiting further recommendation and evaluation by neurosurgery team  Acute comminuted nondisplaced fracture of the proximal fibular diaphysis: Seen by orthopedics, ankle x-ray [place] recommending weightbearing as tolerated continue pain control  Fall/deconditioning/impaired mobility: As per patient's son  he was less active for the last few months after hospitalization for GI bleed and iron deficiency anemia.  Just recently started to be more active but he still had not recovered.   He fell twice in last 1 week.  No other neurological deficits noted on exam.  PT OT once cleared by neurosurgery to determine further disposition    Schizophrenia Anxiety disorder: Continue home Geodon  OSA continue CPAP/nocturnal oxygen  Dilated/fluid-filled esophagus noted in the CT scan no symptoms of reflux or gastritis, outpatient follow-up  Aortic atherosclerosis at this time avoiding aspirin antiplatelets due to epidural hematoma needs follow-up with PCP  Class I Obesity:Patient's Body mass index is 33.91 kg/m. : Will benefit with PCP follow-up, weight loss  healthy lifestyle  DVT prophylaxis: SCDs Start: 07/31/22 1843 Code Status:   Code Status: Full Code Family Communication: plan of care discussed with patient at bedside. Patient status is: Inpatient because of back pain Level of care: Med-Surg   Dispo: The patient is from: Home, lives with a friend who takes care of            Anticipated disposition: tbd pending neurosurgery clearance Objective: Vitals last 24 hrs: Vitals:   08/03/22 2059 08/03/22 2148 08/04/22 0532 08/04/22 0750  BP: 115/65  (!) 152/69 (!) 151/87  Pulse: 79 82 64 73  Resp: 16 18 16  Temp: 98.4 F (36.9 C)  98 F (36.7 C) 98.2 F (36.8 C)  TempSrc: Oral  Oral Oral  SpO2: 94% 95% 95% 95%  Weight:      Height:        Weight change:   Physical Examination: General exam: alert awake, older than stated age HEENT:Oral mucosa moist, Ear/Nose WNL grossly Respiratory system: bilaterally clear BS, no use of accessory muscle Cardiovascular system: S1 & S2 +, No JVD. Gastrointestinal system: Abdomen soft,NT,ND, BS+ Nervous System:Alert, awake, moving extremities. Extremities: LE edema neg,distal peripheral pulses palpable.  Skin: No rashes,no icterus. MSK: Normal muscle bulk,tone, power  Medications reviewed:  Scheduled Meds:  acetaminophen  1,000 mg Oral TID   allopurinol  100 mg Oral Daily   ferrous sulfate  325 mg Oral Q breakfast   sodium chloride flush  3 mL Intravenous Q12H   ziprasidone  60 mg Oral QPM   ziprasidone  80 mg Oral QPM   Continuous Infusions:  methocarbamol (ROBAXIN) IV Stopped (07/31/22 2304)      Diet Order             Diet regular Room service appropriate? Yes; Fluid consistency: Thin  Diet effective now                  Intake/Output Summary (Last 24 hours) at 08/04/2022 1014 Last data filed at 08/04/2022 0810 Gross per 24 hour  Intake 200 ml  Output 750 ml  Net -550 ml   Net IO Since Admission: -1,061.95 mL [08/04/22 1014]  Wt Readings from Last 3 Encounters:  07/31/22 113.4 kg  01/09/21 120.7 kg  01/19/19 118.8 kg     Unresulted Labs (From admission, onward)    None      Data Reviewed: I have personally reviewed following labs and imaging studies CBC: Recent Labs  Lab 07/31/22 1630 08/01/22 0558  WBC 6.3 5.4  NEUTROABS 4.5  --   HGB 13.3 12.0*  HCT 43.9 39.3  MCV 89.6 89.7  PLT 207 [id number]   Basic Metabolic Panel: Recent Labs  Lab 07/31/22 1630 08/01/22 0558  NA 140 140  K 3.7 3.3*  CL 105 105  CO2 24 26  GLUCOSE 109* 92  BUN 14 14  CREATININE 0.92 0.88  CALCIUM 8.7* 8.4*  GFR: Estimated Creatinine Clearance: 91.4 mL/min (by C-G formula based on SCr of 0.88 mg/dL). Liver Function Tests: Recent Labs  Lab 08/01/22 0558  AST 40   ALT 31  ALKPHOS 49  BILITOT 0.3  PROT 5.6*  ALBUMIN 3.0*  No results found for this or any previous visit (from the past 240 hour(s)).  Antimicrobials: Anti-infectives (From admission, onward)    None     Culture/Microbiology No results found for: "SDES", "SPECREQUEST", "CULT", "REPTSTATUS"  Other culture-see note   Radiology Studies: No results found.   LOS: 4 days   Antonieta Pert, MD [facility]  08/04/2022, 10:14 AM

## 2022-08-04 NOTE — Progress Notes (Signed)
Pt placed on CPAP for the night. 

## 2022-08-04 NOTE — Plan of Care (Signed)
  Problem: Activity: Goal: Risk for activity intolerance will decrease Outcome: Progressing   Problem: Nutrition: Goal: Adequate nutrition will be maintained Outcome: Progressing   

## 2022-08-04 NOTE — Progress Notes (Signed)
NEUROSURGERY PROGRESS NOTE  Doing pretty good, no acute events overnight. S/p fall with  T9-T10 chance fracture. Continue bedrest for now. Pain seems to be pretty mild right now.  Temp:  [98 F (36.7 C)-98.4 F (36.9 C)] 98.2 F (36.8 C) (03/17 0750) Pulse Rate:  [64-82] 73 (03/17 0750) Resp:  [16-18] 16 (03/17 0532) BP: (115-152)/(65-87) 151/87 (03/17 0750) SpO2:  [94 %-95 %] 95 % (03/17 0750) FiO2 (%):  [21 %] 21 % (03/16 2148)   Eleonore Chiquito, NP 08/04/2022 8:58 AM

## 2022-08-05 ENCOUNTER — Ambulatory Visit (HOSPITAL_COMMUNITY): Payer: Medicare Other | Attending: Orthopedic Surgery

## 2022-08-05 ENCOUNTER — Encounter (HOSPITAL_COMMUNITY): Payer: Self-pay

## 2022-08-05 ENCOUNTER — Ambulatory Visit (HOSPITAL_COMMUNITY): Admission: RE | Admit: 2022-08-05 | Payer: Medicare Other | Source: Ambulatory Visit

## 2022-08-05 DIAGNOSIS — S22079A Unspecified fracture of T9-T10 vertebra, initial encounter for closed fracture: Secondary | ICD-10-CM | POA: Diagnosis not present

## 2022-08-05 LAB — BASIC METABOLIC PANEL
Anion gap: 6 (ref 5–15)
BUN: 18 mg/dL (ref 8–23)
CO2: 25 mmol/L (ref 22–32)
Calcium: 8.3 mg/dL — ABNORMAL LOW (ref 8.9–10.3)
Chloride: 106 mmol/L (ref 98–111)
Creatinine, Ser: 0.85 mg/dL (ref 0.61–1.24)
GFR, Estimated: >60 mL/min (ref >60)
Glucose, Bld: 94 mg/dL (ref 70–99)
Potassium: 3.9 mmol/L (ref 3.5–5.1)
Sodium: 137 mmol/L (ref 135–145)

## 2022-08-05 NOTE — Progress Notes (Signed)
PROGRESS NOTE Kenneth Watts  [id number] DOB: 23-Aug-1945 DOA: 07/31/2022 PCP: [name], MD  Brief Narrative/Hospital Course: 77 y.o.m w/ obesity, HTN, HLD, OSA on CPAP and nightly O2, COPD, gout, dyspepsia, schizophrenia, anxiety, diverticulosis on 3/13-presented to the ED with complaint of severe back pain after recent 2 falls in the past week and feels legs are weak and give out while walking. He saw orthopedics Dr. Mardelle Matte a few days ago.  X-rays were done showing some compression fractures.  Most recent fall on 3/12 caused severe pain and difficulty walking and hence presented to the ED. In the [id number] labs with unremarkable CBC, BMP. X-ray right knee showed acute comminuted nondisplaced fracture of the proximal fibular diaphysis.CT thoracic and lumbar spine showed: Acute fracture through T9-10 disc space extending into the posterior aspect of the inferior endplate of T9, through the posterior elements of T9 bilaterally.Findings suggestive of possible epidural hematoma in the dorsal soft tissues at the level of the fracture with moderate mass effect on the thecal sac. possible underlying ankylosing spondylitis. MRI thoracic and lumbar spine confirmed the finding noted earlier in the CT scan.   Patient was seen by neurosurgeon Dr. Christella Noa and has been placed on pain management, bedrest and neurosurgery evaluating following closely for further recommendation.    Subjective: Seen and examined this morning.  Resting comfortably pain is controlled, did take Dilaudid this morning along with muscle relaxant and oxycodone  On bedrest  Denies any weakness in lower extremities or numbness  Assessment and Plan: Principal Problem:   Thoracic spine fracture (HCC) Active Problems:   Schizophrenia (HCC)   OSA (obstructive sleep apnea)   Gout   HTN (hypertension)   Hyperlipidemia  Acute T9 fracture with epidural hematoma secondary to fall: CT MRI thoracic and lumbar spine reviewed as  above neurosurgery following closely, continue pain management-will oral/IV opiates, muscle relaxant, nonopiates.Avoiding chemical DVT prophylaxis due to epidural hematoma.  Remains in bed as per neurosurgery pending further recommendation- message sent.   Acute comminuted nondisplaced fracture of the proximal fibular diaphysis: Seen by orthopedics, ankle x-ray and recommended weightbearing as tolerated continue pain control  Fall/deconditioning/impaired mobility: As per patient's son  he was less active for the last few months after hospitalization for GI bleed and iron deficiency anemia.  Just recently started to be more active but he still had not recovered.   He fell twice in last 1 week.  No other neurological deficits noted on exam.  PT OT once cleared by neurosurgery to determine further disposition    Schizophrenia Anxiety disorder: Mood is stable, continue home Geodon  OSA continue CPAP/nocturnal oxygen  Dilated/fluid-filled esophagus noted in the CT scan no symptoms of reflux or gastritis, outpatient follow-up  Aortic atherosclerosis at this time avoiding aspirin antiplatelets due to epidural hematoma needs follow-up with PCP  Class I Obesity:Patient's Body mass index is 33.91 kg/m. : Will benefit with PCP follow-up, weight loss  healthy lifestyle  DVT prophylaxis: SCDs Start: 07/31/22 1843 Code Status:   Code Status: Full Code Family Communication: plan of care discussed with patient at bedside. Patient status is: Inpatient because of back pain Level of care: Med-Surg   Dispo: The patient is from: Home, lives with a friend who takes care of            Anticipated disposition: tbd pending neurosurgery clearance Objective: Vitals last 24 hrs: Vitals:   08/04/22 2156 08/04/22 2204 08/05/22 0549 08/05/22 0750  BP:  (!) 118/51 (!) 158/74 (!) 143/67  Pulse: 72 63 63 74  Resp: 20 16 20    Temp:  97.7 F (36.5 C) 98.4 F (36.9 C) 98.3 F (36.8 C)  TempSrc:  Axillary Oral Oral   SpO2: 95% 93% 98% 96%  Weight:      Height:       Weight change:   Physical Examination: General exam: alert awake, oriented x 3, older than stated age HEENT:Oral mucosa moist, Ear/Nose WNL grossly Respiratory system: Bilaterally clear BS, no use of accessory muscle Cardiovascular system: S1 & S2 +, No JVD. Gastrointestinal system: Abdomen soft,NT,ND, BS+ Nervous System: Alert, awake, moving UE/LE extremities, he follows commands. Extremities: LE edema neg,distal peripheral pulses palpable.  Skin: No rashes,no icterus. MSK: Normal muscle bulk,tone, power   Medications reviewed:  Scheduled Meds:  acetaminophen  1,000 mg Oral TID   allopurinol  100 mg Oral Daily   ferrous sulfate  325 mg Oral Q breakfast   sodium chloride flush  3 mL Intravenous Q12H   ziprasidone  60 mg Oral QPM   ziprasidone  80 mg Oral QPM   Continuous Infusions:  methocarbamol (ROBAXIN) IV Stopped (07/31/22 2304)      Diet Order             Diet regular Room service appropriate? Yes; Fluid consistency: Thin  Diet effective now                  Intake/Output Summary (Last 24 hours) at 08/05/2022 1112 Last data filed at 08/05/2022 0750 Gross per 24 hour  Intake 557 ml  Output 1400 ml  Net -843 ml    Net IO Since Admission: -1,904.95 mL [08/05/22 1112]  Wt Readings from Last 3 Encounters:  07/31/22 113.4 kg  01/09/21 120.7 kg  01/19/19 118.8 kg     Unresulted Labs (From admission, onward)    None      Data Reviewed: I have personally reviewed following labs and imaging studies CBC: Recent Labs  Lab 07/31/22 1630 08/01/22 0558  WBC 6.3 5.4  NEUTROABS 4.5  --   HGB 13.3 12.0*  HCT 43.9 39.3  MCV 89.6 89.7  PLT 207 [id number]    Basic Metabolic Panel: Recent Labs  Lab 07/31/22 1630 08/01/22 0558 08/05/22 0425  NA 140 140 137  K 3.7 3.3* 3.9  CL 105 105 106  CO2 24 26 25   GLUCOSE 109* 92 94  BUN 14 14 18   CREATININE 0.92 0.88 0.85  CALCIUM 8.7* 8.4* 8.3*    GFR: Estimated Creatinine Clearance: 94.6 mL/min (by C-G formula based on SCr of 0.85 mg/dL). Liver Function Tests: Recent Labs  Lab 08/01/22 0558  AST 40  ALT 31  ALKPHOS 49  BILITOT 0.3  PROT 5.6*  ALBUMIN 3.0*   No results found for this or any previous visit (from the past 240 hour(s)).  Antimicrobials: Anti-infectives (From admission, onward)    None     Culture/Microbiology No results found for: "SDES", "SPECREQUEST", "CULT", "REPTSTATUS"  Other culture-see note   Radiology Studies: No results found.   LOS: 5 days   Antonieta Pert, MD [facility]  08/05/2022, 11:12 AM

## 2022-08-05 NOTE — Plan of Care (Signed)
  Problem: Activity: Goal: Risk for activity intolerance will decrease Outcome: Progressing   Problem: Elimination: Goal: Will not experience complications related to bowel motility Outcome: Progressing   Problem: Pain Managment: Goal: General experience of comfort will improve Outcome: Progressing   

## 2022-08-05 NOTE — Progress Notes (Signed)
Patient ID: Kenneth Watts, male   DOB: 1945-11-01, 77 y.o.   MRN: [id number] BP (!) 159/70   Pulse 75   Temp 98.4 F (36.9 C) (Oral)   Resp 20   Ht 6' (1.829 m)   Wt 113.4 kg   SpO2 95%   BMI 33.91 kg/m  Alert and oriented x 4 Speech is clear Did get up with PT today They have recommended snf placement. I agree Moving lower extremities well

## 2022-08-05 NOTE — Progress Notes (Signed)
PT Cancellation Note  Patient Details Name: SHAYAN KOWITZ MRN: [id number] DOB: 01/17/1946   Cancelled Treatment:    Reason Eval/Treat Not Completed: Active bedrest order (Pt continues on bedrest. Will assess when activity orders updated.)  Tomma Rakers, DPT, [place] Office: [phone] (Secure chat preferred)   Ander Purpura 08/05/2022, 1:04 PM

## 2022-08-05 NOTE — Progress Notes (Signed)
I paged Neurosurgery office and got a call back from Dr. Christella Noa: he advised to remove the bed dress from today and get him up and have physical therapy evaluate him. If he has significant pain he advised to get thoracic spine upright and supine X-ray.

## 2022-08-05 NOTE — Progress Notes (Signed)
OT Cancellation Note  Patient Details Name: Kenneth Watts MRN: [id number] DOB: [birth year]   Cancelled Treatment:    Reason Eval/Treat Not Completed: Active bedrest order (Pt continues on bedrest. Will assess when activity orders updated.)  [name],HILLARY 08/05/2022, 11:01 AM [name], OT/L   Acute OT Clinical Specialist Acute Rehabilitation Services Pager [phone] Office [phone]

## 2022-08-05 NOTE — Evaluation (Signed)
Occupational Therapy Evaluation Patient Details Name: Kenneth Watts MRN: [id number] DOB: Dec 06, 1945 Today's Date: 08/05/2022   History of Present Illness Pt is a 77 y.o. male presenting 3/13 with back pain after multiple ground-level falls. CT and MRI showed T9 fracture with epidural hematoma and R fibular head fracture. PMH significant for OSA, HTN, HLD, gout, dyspepsia, schizophrenia, anxiety, COPD, L THA and diverticulitis.   Clinical Impression   PTA pt lives with his significant other and is modified independent with ADL and mobility using a straight cane. Pt with a recent history of falls, however at baseline, he walks at the "[place] track". Ortho tech delivered an extender during session to improve the fit of the brace due to pt's body habitus. Began education on back precautions. Pt able to ambulate @ 40 ft and tolerated well. Prior to mobility, pain was a "3-4"; after mobility pain was a "4-5". No complaints of numbness/tingling, however did complain of R knee pain. Given recent history of falls, current fall risk and assist needed for ADL tasks, feel Kenneth Watts will benefit from post acute rehab at [facility]. Acute OT to follow. VSS on RA.     Recommendations for follow up therapy are one component of a multi-disciplinary discharge planning process, led by the attending physician.  Recommendations may be updated based on patient status, additional functional criteria and insurance authorization.   Follow Up Recommendations  Skilled nursing-short term rehab (<3 hours/day)     Assistance Recommended at Discharge Frequent or constant Supervision/Assistance  Patient can return home with the following A little help with walking and/or transfers;A lot of help with bathing/dressing/bathroom;Assistance with cooking/housework;Assist for transportation;Help with stairs or ramp for entrance    Functional Status Assessment  Patient has had a recent decline in their functional status and  demonstrates the ability to make significant improvements in function in a reasonable and predictable amount of time.  Equipment Recommendations  BSC/3in1    Recommendations for Other Services PT consult     Precautions / Restrictions Precautions Precautions: Back;Fall Required Braces or Orthoses: Spinal Brace Spinal Brace: Thoracolumbosacral orthotic Restrictions Weight Bearing Restrictions: No      Mobility Bed Mobility Overal bed mobility: Needs Assistance Bed Mobility: Rolling, Sidelying to Sit Rolling: Min guard Sidelying to sit: Min assist            Transfers Overall transfer level: Needs assistance   Transfers: Sit to/from Stand, Bed to chair/wheelchair/BSC Sit to Stand: Min assist, +2 safety/equipment                  Balance Overall balance assessment: History of Falls, Needs assistance   Sitting balance-Leahy Scale: Good       Standing balance-Leahy Scale: Poor                             ADL either performed or assessed with clinical judgement   ADL Overall ADL's : Needs assistance/impaired     Grooming: Set up;Supervision/safety;Sitting   Upper Body Bathing: Set up;Supervision/ safety;Sitting   Lower Body Bathing: Moderate assistance;Sit to/from stand   Upper Body Dressing : Moderate assistance Upper Body Dressing Details (indicate cue type and reason): to donn brace Lower Body Dressing: Maximal assistance;Sit to/from stand   Toilet Transfer: Minimal assistance;+2 for safety/equipment   Toileting- Clothing Manipulation and Hygiene: Maximal assistance       Functional mobility during ADLs: Minimal assistance;+2 for safety/equipment;Cueing for safety;Cueing for sequencing;Rolling walker (2 wheels) General  ADL Comments: Began education regaridng compensatory strategies; unable to complete figure four positioning     Vision Baseline Vision/History: 1 Wears glasses Ability to See in Adequate Light:  (wears glasses at  night when driving)       Perception     Praxis      Pertinent Vitals/Pain Pain Assessment Pain Assessment: 0-10 Pain Score: 3  Pain Location: back Pain Descriptors / Indicators: Grimacing, Guarding, Discomfort Pain Intervention(s): Limited activity within patient's tolerance, Patient requesting pain meds-RN notified     Hand Dominance Right   Extremity/Trunk Assessment Upper Extremity Assessment Upper Extremity Assessment: Overall WFL for tasks assessed   Lower Extremity Assessment Lower Extremity Assessment: Defer to PT evaluation   Cervical / Trunk Assessment Cervical / Trunk Assessment: Kyphotic;Other exceptions (back fx)   Communication Communication Communication: No difficulties   Cognition Arousal/Alertness: Awake/alert Behavior During Therapy: WFL for tasks assessed/performed Overall Cognitive Status: No family/caregiver present to determine baseline cognitive functioning                                 General Comments: Hit head head when he feel, "but not hard"; most likely baseline     General Comments  VSS    Exercises     Shoulder Instructions      Home Living Family/patient expects to be discharged to:: Skilled nursing facility Living Arrangements: Spouse/significant other                                      Prior Functioning/Environment Prior Level of Function : Independent/Modified Independent             Mobility Comments: uses a cane - in in the house and in the community          OT Problem List: Decreased strength;Decreased range of motion;Decreased activity tolerance;Impaired balance (sitting and/or standing);Decreased safety awareness;Decreased knowledge of use of DME or AE;Decreased knowledge of precautions;Obesity;Pain      OT Treatment/Interventions: Self-care/ADL training;Therapeutic exercise;DME and/or AE instruction;Therapeutic activities;Balance training;Patient/family education    OT  Goals(Current goals can be found in the care plan section) Acute Rehab OT Goals Patient Stated Goal: to get some rehab for 20 days OT Goal Formulation: With patient Time For Goal Achievement: 08/19/22 Potential to Achieve Goals: Good  OT Frequency: Min 2X/week    Co-evaluation              AM-PAC OT "6 Clicks" Daily Activity     Outcome Measure Help from another person eating meals?: None Help from another person taking care of personal grooming?: A Little Help from another person toileting, which includes using toliet, bedpan, or urinal?: A Lot Help from another person bathing (including washing, rinsing, drying)?: A Lot Help from another person to put on and taking off regular upper body clothing?: A Lot Help from another person to put on and taking off regular lower body clothing?: A Lot 6 Click Score: 15   End of Session Equipment Utilized During Treatment: Gait belt;Rolling walker (2 wheels);Back brace Nurse Communication: Mobility status;Other (comment) (IV R arm needs attention; red/edematous)  Activity Tolerance: Patient tolerated treatment well Patient left: in chair;with call bell/phone within reach;with chair alarm set  OT Visit Diagnosis: Unsteadiness on feet (R26.81);Other abnormalities of gait and mobility (R26.89);Repeated falls (R29.6);Muscle weakness (generalized) (M62.81);Pain Pain - Right/Left: Right Pain - part of  body: Knee (back)                Time: [id number] OT Time Calculation (min): 39 min Charges:  OT General Charges $OT Visit: 1 Visit OT Evaluation $OT Eval Moderate Complexity: 1 Mod OT Treatments $Self Care/Home Management : 8-22 mins $Therapeutic Activity: 8-22 mins  [name], OT/L   Acute OT Clinical Specialist [place] Pager [phone] Office [phone]   [facility] 08/05/2022, 4:01 PM

## 2022-08-05 NOTE — Care Management Important Message (Signed)
Important Message  Patient Details  Name: Kenneth Watts MRN: [id number] Date of Birth: June 15, 1945   Medicare Important Message Given:  Yes     Hannah Beat 08/05/2022, 11:45 AM

## 2022-08-05 NOTE — Evaluation (Signed)
Physical Therapy Evaluation Patient Details Name: DELIO SHNAYDER MRN: [id number] DOB: 03/03/1946 Today's Date: 08/05/2022  History of Present Illness  Pt is a 77 y.o. male presenting 3/13 with back pain after multiple ground-level falls. CT and MRI showed T9 fracture with epidural hematoma and R fibular head fracture. PMH significant for OSA, HTN, HLD, gout, dyspepsia, schizophrenia, anxiety, COPD, L THA and diverticulitis.  Clinical Impression  Pt presents to PT with deficits in strength, power, functional mobility, gait, balance, endurance. Pt currently benefits form physical assistance to perform bed mobility, along with cues to remind of back precautions. Pt fatigues quickly, and ambulates with short shuffling steps, remaining at a high risk for falls. Pt's roommate reports a cognitive decline recently, reports this likely contributed to his fall. PT recommends SNF placement at this time.       Recommendations for follow up therapy are one component of a multi-disciplinary discharge planning process, led by the attending physician.  Recommendations may be updated based on patient status, additional functional criteria and insurance authorization.  Follow Up Recommendations Skilled nursing-short term rehab (<3 hours/day) Can patient physically be transported by private vehicle: Yes    Assistance Recommended at Discharge Frequent or constant Supervision/Assistance  Patient can return home with the following  A little help with walking and/or transfers;A lot of help with bathing/dressing/bathroom;Assistance with cooking/housework;Direct supervision/assist for medications management;Direct supervision/assist for financial management;Assist for transportation;Help with stairs or ramp for entrance    Equipment Recommendations  (defer to post-acute)  Recommendations for Other Services       Functional Status Assessment Patient has had a recent decline in their functional status and  demonstrates the ability to make significant improvements in function in a reasonable and predictable amount of time.     Precautions / Restrictions Precautions Precautions: Back;Fall Precaution Booklet Issued: No Precaution Comments: verbally reviewed back precautions Required Braces or Orthoses: Spinal Brace Spinal Brace: Thoracolumbosacral orthotic Restrictions Weight Bearing Restrictions: No      Mobility  Bed Mobility Overal bed mobility: Needs Assistance Bed Mobility: Sit to Sidelying, Rolling Rolling: Min assist       Sit to sidelying: Min assist General bed mobility comments: verbal cues for log roll technique, poor recall from OT session    Transfers Overall transfer level: Needs assistance   Transfers: Sit to/from Stand Sit to Stand: Min assist                Ambulation/Gait Ambulation/Gait assistance: Min guard Gait Distance (Feet): 40 Feet Assistive device: Rolling walker (2 wheels) Gait Pattern/deviations: Step-through pattern Gait velocity: reduced Gait velocity interpretation: <1.31 ft/sec, indicative of household ambulator   General Gait Details: slowed step-through gait, reduced stride length  Stairs            Wheelchair Mobility    Modified Rankin (Stroke Patients Only)       Balance Overall balance assessment: Needs assistance, History of Falls Sitting-balance support: No upper extremity supported, Feet supported Sitting balance-Leahy Scale: Good     Standing balance support: Bilateral upper extremity supported, Reliant on assistive device for balance Standing balance-Leahy Scale: Poor                               Pertinent Vitals/Pain Pain Assessment Pain Assessment: 0-10 Pain Score: 5  Pain Location: back Pain Descriptors / Indicators: Aching Pain Intervention(s): Monitored during session    Home Living Family/patient expects to be discharged to:: Skilled nursing  facility Living Arrangements:  Spouse/significant other                 Additional Comments: pt reportedly lives with the spouse of a friend who recently passed away, Samantha Crimes.    Prior Function Prior Level of Function : Independent/Modified Independent             Mobility Comments: uses a cane - in in the house and in the community ADLs Comments: Barbaraann Share reports a decline in cognition prior to fall. She is concerned this may have caused his fall     Hand Dominance   Dominant Hand: Right    Extremity/Trunk Assessment   Upper Extremity Assessment Upper Extremity Assessment: Generalized weakness    Lower Extremity Assessment Lower Extremity Assessment: Generalized weakness    Cervical / Trunk Assessment Cervical / Trunk Assessment: Kyphotic;Other exceptions Cervical / Trunk Exceptions: TLSO, T9 fx  Communication   Communication: No difficulties  Cognition Arousal/Alertness: Awake/alert Behavior During Therapy: WFL for tasks assessed/performed Overall Cognitive Status: Impaired/Different from baseline                                 General Comments: pt participates appropriately during session, pt's roommate Barbaraann Share reports noticing a cognitive decline prior to fall        General Comments General comments (skin integrity, edema, etc.): VSS on RA    Exercises     Assessment/Plan    PT Assessment Patient needs continued PT services  PT Problem List Decreased strength;Decreased activity tolerance;Decreased balance;Decreased mobility;Decreased cognition;Decreased knowledge of use of DME;Decreased safety awareness;Decreased knowledge of precautions;Pain       PT Treatment Interventions DME instruction;Gait training;Functional mobility training;Therapeutic activities;Balance training;Therapeutic exercise;Neuromuscular re-education;Cognitive remediation;Patient/family education;Stair training    PT Goals (Current goals can be found in the Care Plan section)  Acute Rehab PT  Goals Patient Stated Goal: to go to SNF, return to independence PT Goal Formulation: With patient Time For Goal Achievement: 08/19/22 Potential to Achieve Goals: Fair    Frequency Min 3X/week     Co-evaluation               AM-PAC PT "6 Clicks" Mobility  Outcome Measure Help needed turning from your back to your side while in a flat bed without using bedrails?: A Little Help needed moving from lying on your back to sitting on the side of a flat bed without using bedrails?: A Little Help needed moving to and from a bed to a chair (including a wheelchair)?: A Little Help needed standing up from a chair using your arms (e.g., wheelchair or bedside chair)?: A Little Help needed to walk in hospital room?: A Little Help needed climbing 3-5 steps with a railing? : Total 6 Click Score: 16    End of Session Equipment Utilized During Treatment: Back brace Activity Tolerance: Patient tolerated treatment well Patient left: in bed;with call bell/phone within reach;with bed alarm set Nurse Communication: Mobility status PT Visit Diagnosis: Other abnormalities of gait and mobility (R26.89);Muscle weakness (generalized) (M62.81);Pain    Time: [id number] PT Time Calculation (min) (ACUTE ONLY): 20 min   Charges:   PT Evaluation $PT Eval Low Complexity: 1 Low          Zenaida Niece, PT, DPT Acute Rehabilitation Office Mariano Colon 08/05/2022, 5:10 PM

## 2022-08-06 DIAGNOSIS — S22079A Unspecified fracture of T9-T10 vertebra, initial encounter for closed fracture: Secondary | ICD-10-CM | POA: Diagnosis not present

## 2022-08-06 LAB — GLUCOSE, CAPILLARY: Glucose-Capillary: 160 mg/dL — ABNORMAL HIGH (ref 70–99)

## 2022-08-06 NOTE — Evaluation (Signed)
Speech Language Pathology Evaluation Patient Details Name: Kenneth Watts MRN: [id number] DOB: [birth year] Today's Date: 08/06/2022 Time: [id number] SLP Time Calculation (min) (ACUTE ONLY): 17 min  Problem List:  Patient Active Problem List   Diagnosis Date Noted   Thoracic spine fracture ([place]) 07/31/2022   Primary localized osteoarthritis of left hip 01/19/2019   Hyperglycemia 05/10/2015   Dyspepsia 01/05/2015   Skin cancer 01/05/2015   Vitamin D deficiency 12/02/2014   Infectious mononucleosis 12/02/2014   Hyperlipidemia 12/01/2014   Insomnia 10/13/2013   Pain due to dental caries 09/29/2013   Nonspecific abnormal electrocardiogram (ECG) (EKG) 07/16/2013   Hypogonadism male 01/15/2013   Schizophrenia ([place]) 01/15/2013   OSA (obstructive sleep apnea) 01/15/2013   Gout 01/15/2013   Melanoma ([place]) 01/15/2013   HTN (hypertension) 01/15/2013   Past Medical History:  Past Medical History:  Diagnosis Date   Anxiety    Avascular necrosis of bones of both hips ([place])    COPD (chronic obstructive pulmonary disease) ([place])    no inhalers used   Diverticulitis    no recent problems   Hypertension    Melanoma ([place]) 2014   left arm   OSA (obstructive sleep apnea)    Oxygen dependent    3 liters with cpap set on 5 at hs   Primary localized osteoarthritis of left hip 01/19/2019   Schizophrenia ([place])    "paranoid schizopherneic with serois anger issues, 3 personalities, multiple psych issues"   Past Surgical History:  Past Surgical History:  Procedure Laterality Date   CHOLECYSTECTOMY     HERNIA REPAIR     chest   MELANOMA EXCISION Left 10/01/12   arm   TOTAL HIP ARTHROPLASTY Left 01/19/2019   Procedure: TOTAL HIP ARTHROPLASTY;  Surgeon: Marchia Bond, MD;  Location: [facility];  Service: Orthopedics;  Laterality: Left;   HPI:  Pt is a 77 y.o .male who presented to the ED with complaint of severe back pain after recent 2 falls in the past week and feels legs are weak and give out while  walking. CT thoracic and lumbar spine showed: Acute fracture through T9-10 disc space extending into the posterior aspect of the inferior endplate of T9, through the posterior elements of T9 bilaterally.Findings suggestive of possible epidural hematoma in the dorsal soft tissues at the level of the fracture with moderate mass effect on the thecal sac. possible underlying ankylosing spondylitis.  MRI thoracic and lumbar spine confirmed the finding noted earlier in the CT scan.  Pt with PMHx obesity, HTN, HLD, OSA on CPAP and nightly O2, COPD, gout, dyspepsia, schizophrenia, anxiety, diverticulosis. Per MD note, 08/05/22, "Possible mild cognitive impairment for few months: as reported by patient's close friend on 3/18."   Assessment / Plan / Recommendation Clinical Impression  Pt seen for cognitive-linguistic evaluation. Evaluation completed via informal means and SLUMS. Pt's basic primary speech/language ability is [facility]. Pt with reduced non-verbal problem solving/executive functioning as evidenced by performance on clockdrawing task as well as reduced immediate/short term recall for unrelated objects and paragraph level information. Pt scored 70/30 on SLUMS whic may be indicative of "mild neurocognitive disorder." Given pt's clinical presentation and reported cognitive decline by significant other, recommend Outpatient Neurology consult for further work up.    SLP Assessment  SLP Recommendation/Assessment: All further Speech Lanaguage Pathology  needs can be addressed in the next venue of care SLP Visit Diagnosis: Cognitive communication deficit (R41.841)    Recommendations for follow up therapy are one component of a multi-disciplinary discharge planning process,  led by the attending physician.  Recommendations may be updated based on patient status, additional functional criteria and insurance authorization.    Follow Up Recommendations  Skilled nursing-short term rehab (<3 hours/day)    Assistance  Recommended at Discharge  Intermittent Supervision/Assistance  Functional Status Assessment Patient has had a recent decline in their functional status and demonstrates the ability to make significant improvements in function in a reasonable and predictable amount of time.        SLP Evaluation Cognition  Overall Cognitive Status: No family/caregiver present to determine baseline cognitive functioning Arousal/Alertness: Awake/alert Orientation Level: Oriented X4 Memory: Impaired Memory Impairment: Decreased recall of new information;Decreased short term memory Decreased Short Term Memory: Verbal basic Awareness: Impaired Awareness Impairment: Emergent impairment Problem Solving: Impaired Problem Solving Impairment: Functional complex Executive Function: Organizing;Self Monitoring;Self Correcting Organizing: Impaired Organizing Impairment: Functional complex Self Monitoring: Impaired Self Monitoring Impairment: Functional complex Self Correcting: Impaired Self Correcting Impairment: Functional complex Safety/Judgment: Impaired       Comprehension  Auditory Comprehension Overall Auditory Comprehension: Appears within functional limits for tasks assessed Yes/No Questions: Within Functional Limits Commands: Within Functional Limits Conversation: Complex [facility]) Visual Recognition/Discrimination Discrimination: Not tested Reading Comprehension Reading Status: Not tested    Expression Expression Primary Mode of Expression: Verbal Verbal Expression Overall Verbal Expression: Appears within functional limits for tasks assessed Initiation: No impairment Automatic Speech:  (WFL) Repetition: Impaired Naming: No impairment Pragmatics: No impairment Written Expression Dominant Hand: Right Written Expression: Within Functional Limits (for clockdrawing)   Oral / Motor  Oral Motor/Sensory Function Overall Oral Motor/Sensory Function: Within functional limits Motor Speech Overall  Motor Speech: Appears within functional limits for tasks assessed Respiration: Within functional limits Phonation: Normal Resonance: Within functional limits Articulation: Within functional limitis Intelligibility: Intelligible Motor Planning: Witnin functional limits           Cherrie Gauze, M.S., [facility] [phone] ([place])  Quintella Baton 08/06/2022, 11:56 AM

## 2022-08-06 NOTE — Progress Notes (Signed)
PT Cancellation Note  Patient Details Name: Kenneth Watts MRN: [id number] DOB: 02-Oct-1945   Cancelled Treatment:    Reason Eval/Treat Not Completed: Patient declined, no reason specified  Patient declines working with physical therapy today. States he walked with mobility team earlier and is tired. Will follow  Candie Mile, PT, DPT Physical Therapist Acute Rehabilitation Services [place] [facility] 08/06/2022, 2:50 PM

## 2022-08-06 NOTE — NC FL2 (Signed)
[place] LEVEL OF CARE FORM     IDENTIFICATION  Patient Name: Kenneth Watts Birthdate: [birth year] Sex: male Admission Date (Current Location): 07/31/2022  [facility] and [place] Number:  Herbalist and Address:  [facility] [place] [place] [address], [place], [place] [address]      Provider Number: [id number]  Attending Physician Name and Address:  Antonieta Pert, MD  Relative Name and Phone Number:  Lamb,Kenneth Watts Significant other [phone]    Current Level of Care: Hospital Recommended Level of Care: [place] Prior Approval Number:    Date Approved/Denied:   PASRR Number:    Discharge Plan: SNF    Current Diagnoses: Patient Active Problem List   Diagnosis Date Noted   Thoracic spine fracture ([place]) 07/31/2022   Primary localized osteoarthritis of left hip 01/19/2019   Hyperglycemia 05/10/2015   Dyspepsia 01/05/2015   Skin cancer 01/05/2015   Vitamin D deficiency 12/02/2014   Infectious mononucleosis 12/02/2014   Hyperlipidemia 12/01/2014   Insomnia 10/13/2013   Pain due to dental caries 09/29/2013   Nonspecific abnormal electrocardiogram (ECG) (EKG) 07/16/2013   Hypogonadism male 01/15/2013   Schizophrenia ([place]) 01/15/2013   OSA (obstructive sleep apnea) 01/15/2013   Gout 01/15/2013   Melanoma ([place]) 01/15/2013   HTN (hypertension) 01/15/2013    Orientation RESPIRATION BLADDER Height & Weight     Self, Time, Situation, Place  Normal Incontinent, External catheter Weight: 250 lb (113.4 kg) Height:  6' (182.9 cm)  BEHAVIORAL SYMPTOMS/MOOD NEUROLOGICAL BOWEL NUTRITION STATUS      Incontinent Diet (see discharge summary)  AMBULATORY STATUS COMMUNICATION OF NEEDS Skin   Supervision Verbally Skin abrasions, Other (Comment) (redness)                       Personal Care Assistance Level of Assistance  Bathing, Feeding, Dressing Bathing Assistance: Limited assistance Feeding assistance: Limited  assistance Dressing Assistance: Maximum assistance     Functional Limitations Info  Sight, Hearing, Speech Sight Info: Adequate Hearing Info: Adequate Speech Info: Adequate    SPECIAL CARE FACTORS FREQUENCY  PT (By licensed PT), OT (By licensed OT)     PT Frequency: 5x week OT Frequency: 5x week            Contractures Contractures Info: Not present    Additional Factors Info  Code Status, Allergies Code Status Info: full Allergies Info: lisinopril           Current Medications (08/06/2022):  This is the current hospital active medication list Current Facility-Administered Medications  Medication Dose Route Frequency Provider Last Rate Last Admin   acetaminophen (TYLENOL) tablet 1,000 mg  1,000 mg Oral TID Terrilee Croak, MD   1,000 mg at 08/06/22 0910   allopurinol (ZYLOPRIM) tablet 100 mg  100 mg Oral Daily Lorella Nimrod, MD   100 mg at 08/06/22 0912   ferrous sulfate tablet 325 mg  325 mg Oral Q breakfast Lorella Nimrod, MD   325 mg at 08/06/22 0741   HYDROmorphone (DILAUDID) injection 0.5-1 mg  0.5-1 mg Intravenous Q3H PRN Marcelyn Bruins, MD   1 mg at 08/06/22 0911   methocarbamol (ROBAXIN) 500 mg in dextrose 5 % 50 mL IVPB  500 mg Intravenous Q6H PRN Marcelyn Bruins, MD   Stopped at 07/31/22 2304   oxyCODONE (Oxy IR/ROXICODONE) immediate release tablet 10 mg  10 mg Oral Q6H PRN Marcelyn Bruins, MD   10 mg at 08/06/22 [phone]  polyethylene glycol (MIRALAX / GLYCOLAX) packet 17 g  17 g Oral Daily PRN Marcelyn Bruins, MD       sodium chloride flush (NS) 0.9 % injection 3 mL  3 mL Intravenous Q12H Marcelyn Bruins, MD   3 mL at 08/06/22 0912   sorbitol 70 % solution 30 mL  30 mL Oral Daily PRN Marcelyn Bruins, MD       ziprasidone (GEODON) capsule 60 mg  60 mg Oral QPM Marcelyn Bruins, MD   60 mg at 08/05/22 1927   ziprasidone (GEODON) capsule 80 mg  80 mg Oral QPM Marcelyn Bruins, MD   80 mg at 08/05/22 1926     Discharge  Medications: Please see discharge summary for a list of discharge medications.  Relevant Imaging Results:  Relevant Lab Results:   Additional Information [phone], pt is vaccinated for covid with boosters  Kenneth Watts, Kenneth Beets, LCSW

## 2022-08-06 NOTE — TOC Progression Note (Addendum)
Transition of Care [facility]) - Progression Note    Patient Details  Name: Kenneth Watts MRN: [id number] Date of Birth: [birth year]  Transition of [facility] CM/SW Contact  Joanne Chars, LCSW Phone Number: 08/06/2022, 11:31 AM  Clinical Narrative:   PT recommendation is for SNF.  Discussed with pt, who is in agreement, wants to go to [facility].  Referral sent and accepted by [facility].    Additional info required for passr.  Per [facility], cannot accept until receives pt CPAP tomorrow.  Auth request submitted and approved in [place]: [id number], 3 days: 3/20-3/22.  PASSR received: [id number]      Expected Discharge Plan: [place] Barriers to Discharge: SNF Pending bed offer  Expected Discharge Plan and Services In-house Referral: Clinical Social Work   Post Acute Care Choice: [place] Living arrangements for the past 2 months: Single Family Home                                       Social Determinants of Health (SDOH) Interventions SDOH Screenings   Tobacco Use: Medium Risk (07/31/2022)    Readmission Risk Interventions     No data to display

## 2022-08-06 NOTE — Discharge Summary (Signed)
Physician Discharge Summary  Kenneth Watts [id number] DOB: 09/16/1945 DOA: 07/31/2022  PCP: Kenneth Ramsay, MD  Admit date: 07/31/2022 *** Discharge date: 08/06/2022 Recommendations for Outpatient Follow-up:  Follow up with PCP in 1 weeks-call for appointment Please obtain BMP/CBC in one week  Discharge Dispo: SNF Discharge Condition: Stable Code Status:   Code Status: Full Code Diet recommendation:  Diet Order             Diet regular Room service appropriate? Yes; Fluid consistency: Thin  Diet effective now                    Brief/Interim Summary: 77 y.o.m w/ obesity, HTN, HLD, OSA on CPAP and nightly O2, COPD, gout, dyspepsia, schizophrenia, anxiety, diverticulosis on 3/13-presented to the ED with complaint of severe back pain after recent 2 falls in the past week and feels legs are weak and give out while walking. He saw orthopedics Dr. Mardelle Matte a few days ago.  X-rays were done showing some compression fractures.  Most recent fall on 3/12 caused severe pain and difficulty walking and hence presented to the ED. In the [id number] labs with unremarkable CBC, BMP. X-ray right knee showed acute comminuted nondisplaced fracture of the proximal fibular diaphysis.CT thoracic and lumbar spine showed: Acute fracture through T9-10 disc space extending into the posterior aspect of the inferior endplate of T9, through the posterior elements of T9 bilaterally.Findings suggestive of possible epidural hematoma in the dorsal soft tissues at the level of the fracture with moderate mass effect on the thecal sac. possible underlying ankylosing spondylitis. MRI thoracic and lumbar spine confirmed the finding noted earlier in the CT scan.   Patient was seen by neurosurgeon Dr. Christella Noa and has been placed on pain management, bedrest and neurosurgery > subsequently removed the bedrest 3/18, seen by PT OT, recommending skilled nursing facility, remains medically stable    Discharge Diagnoses:   Principal Problem:   Thoracic spine fracture ([place]) Active Problems:   Schizophrenia ([place])   OSA (obstructive sleep apnea)   Gout   HTN (hypertension)   Hyperlipidemia  Acute T9 fracture with epidural hematoma secondary to fall: CT MRI thoracic and lumbar spine reviewed as above neurosurgery following closely, bedrest removed 3/15, working with PT OT recommending skilled nursing facility.  Continue current pain management Relaxants, no recurrence or worsening of back pain with mobility.Avoiding chemical DVT prophylaxis due to epidural hematoma.    Acute comminuted nondisplaced fracture of the proximal fibular diaphysis: Seen by orthopedics, ankle x-ray and recommended weightbearing as tolerated continue pain control  Fall/deconditioning/impaired mobility: As per patient's son  he was less active for the last few months after hospitalization for GI bleed and iron deficiency anemia.  Just recently started to be more active but he still had not recovered.   He fell twice in last 1 week.  No other neurological deficits noted on exam. Cont  PT OT  Possible mild cognitive impairment for few months: as reported by patient's close friend on 3/18-SLP eval obtained, strongly encouraged to follow-up with outpatient primary care provider.  Patient denies any memory issues, reports he is able to do his finances by himself.  Schizophrenia Anxiety disorder: Mood is stable, continue home [place]  OSA continue CPAP/nocturnal oxygen  Dilated/fluid-filled esophagus noted in the CT scan no symptoms of reflux or gastritis, outpatient follow-up  Aortic atherosclerosis at this time avoiding aspirin antiplatelets due to epidural hematoma needs follow-up with PCP  Class I Obesity:Patient's Body mass index is 33.[place]  kg/m. : Will benefit with PCP follow-up, weight loss  healthy lifestyle  Consults: Neurosurgery Orthopedics  Subjective: ***  Discharge Exam: Vitals:   08/06/22 0516 08/06/22 0738  BP: (!)  157/66 (!) 156/71  Pulse: 61 73  Resp: 18 19  Temp: 97.7 F (36.5 C) 98.7 F (37.1 C)  SpO2: 100%    General: Pt is alert, awake, not in acute distress Cardiovascular: RRR, S1/S2 +, no rubs, no gallops Respiratory: CTA bilaterally, no wheezing, no rhonchi Abdominal: Soft, NT, ND, bowel sounds + Extremities: no edema, no cyanosis  Discharge Instructions   Allergies as of 08/06/2022       Reactions   Lisinopril Other (See Comments)   Cough     Med Rec must be completed prior to using this [facility]***       Follow-up Information     Connect with your PCP/Specialist as discussed. Schedule an appointment as soon as possible for a visit .   Contact information: [email or web address] Call our physician referral line at [phone].        Kenneth Ramsay, MD Follow up.   Specialty: Infectious Diseases Contact information: [place] [id number] [phone]                Allergies  Allergen Reactions   Lisinopril Other (See Comments)    Cough    The results of significant diagnostics from this hospitalization (including imaging, microbiology, ancillary and laboratory) are listed below for reference.    Microbiology: No results found for this or any previous visit (from the past 240 hour(s)).  Procedures/Studies: DG Ankle Complete Right  Result Date: 08/01/2022 CLINICAL DATA:  Ankle pain. EXAM: RIGHT ANKLE - COMPLETE 3+ VIEW COMPARISON:  None Available. FINDINGS: The ankle mortise is maintained. No significant degenerative changes or obvious joint effusion. No acute ankle fracture is identified. Band of increased density across the distal fibular shaft could be an old healed fracture. No osteochondral lesion. On the lateral film there is a sclerotic bony density noted off the dorsal aspect of the distal talus. This has the appearance of an old avulsion fracture. The visualized mid and hindfoot bony structures  are intact. IMPRESSION: 1. No acute bony findings or significant degenerative changes. 2. Probable old avulsion fracture off the dorsal aspect of the distal talus. 3. Possible old healed distal fibular shaft fracture. Electronically Signed   By: Marijo Sanes M.D.   On: 08/01/2022 11:26   MR THORACIC SPINE WO CONTRAST  Addendum Date: 07/31/2022   ADDENDUM REPORT: 07/31/2022 19:29 ADDENDUM: Critical Value/emergent results were called by telephone at the time of interpretation on 07/31/2022 at 7:25 pm to provider Dr. Trilby Drummer, who verbally acknowledged these results. Electronically Signed   By: Emmit Alexanders M.D.   On: 07/31/2022 19:29   Result Date: 07/31/2022 CLINICAL DATA:  Mid and low back pain, neuro deficit.  Trauma. EXAM: MRI THORACIC AND LUMBAR SPINE WITHOUT CONTRAST TECHNIQUE: Multiplanar and multiecho pulse sequences of the thoracic and lumbar spine were obtained without intravenous contrast. COMPARISON:  CT thoracic and lumbar spine 07/31/2022. FINDINGS: MRI THORACIC SPINE FINDINGS Alignment:  Normal. Vertebrae: Redemonstrated fracture through the T9-10 disc space with extension into the posterior elements at T9. Associated mild marrow edema in the posterior aspect of the T10 superior endplate without extension into the posterior elements. Cord: Dorsal epidural hematoma centered at T8-9, extending from T7 to T10, with compression of the spinal cord at the T10-11 level. No definite spinal cord edema. Paraspinal and  other soft tissues: Mild bilateral paravertebral edema and hematoma centered around the T9-10 level. Redemonstration of small bilateral pleural effusions. Disc levels: No significant degenerative change. MRI LUMBAR SPINE FINDINGS Segmentation:  Standard. Alignment:  Physiologic. Vertebrae:  No fracture, evidence of discitis, or bone lesion. Conus medullaris and cauda equina: Conus extends to the L1 level. Conus and cauda equina appear normal. Paraspinal and other soft tissues: Unremarkable.  Disc levels: Mild lower lumbar facet arthropathy without significant spinal canal stenosis or neural foraminal narrowing. IMPRESSION: 1. Confirmed dorsal epidural hematoma centered at T8-9, extending from T7 to T10, with compression of the spinal cord at the T10-11 level. No definite spinal cord edema. 2. Redemonstrated fracture through the T9-10 disc space with extension into the posterior elements at T9. Associated mild marrow edema in the posterior aspect of the T10 superior endplate without extension into the posterior elements. Radiology assistant personnel have been notified to put me in telephone contact with the referring physician or the referring physician's clinical representative in order to discuss these findings. Once this communication is established I will issue an addendum to this report for documentation purposes. Electronically Signed: By: Emmit Alexanders M.D. On: 07/31/2022 19:15   MR LUMBAR SPINE WO CONTRAST  Addendum Date: 07/31/2022   ADDENDUM REPORT: 07/31/2022 19:29 ADDENDUM: Critical Value/emergent results were called by telephone at the time of interpretation on 07/31/2022 at 7:25 pm to provider Dr. Trilby Drummer, who verbally acknowledged these results. Electronically Signed   By: Emmit Alexanders M.D.   On: 07/31/2022 19:29   Result Date: 07/31/2022 CLINICAL DATA:  Mid and low back pain, neuro deficit.  Trauma. EXAM: MRI THORACIC AND LUMBAR SPINE WITHOUT CONTRAST TECHNIQUE: Multiplanar and multiecho pulse sequences of the thoracic and lumbar spine were obtained without intravenous contrast. COMPARISON:  CT thoracic and lumbar spine 07/31/2022. FINDINGS: MRI THORACIC SPINE FINDINGS Alignment:  Normal. Vertebrae: Redemonstrated fracture through the T9-10 disc space with extension into the posterior elements at T9. Associated mild marrow edema in the posterior aspect of the T10 superior endplate without extension into the posterior elements. Cord: Dorsal epidural hematoma centered at T8-9,  extending from T7 to T10, with compression of the spinal cord at the T10-11 level. No definite spinal cord edema. Paraspinal and other soft tissues: Mild bilateral paravertebral edema and hematoma centered around the T9-10 level. Redemonstration of small bilateral pleural effusions. Disc levels: No significant degenerative change. MRI LUMBAR SPINE FINDINGS Segmentation:  Standard. Alignment:  Physiologic. Vertebrae:  No fracture, evidence of discitis, or bone lesion. Conus medullaris and cauda equina: Conus extends to the L1 level. Conus and cauda equina appear normal. Paraspinal and other soft tissues: Unremarkable. Disc levels: Mild lower lumbar facet arthropathy without significant spinal canal stenosis or neural foraminal narrowing. IMPRESSION: 1. Confirmed dorsal epidural hematoma centered at T8-9, extending from T7 to T10, with compression of the spinal cord at the T10-11 level. No definite spinal cord edema. 2. Redemonstrated fracture through the T9-10 disc space with extension into the posterior elements at T9. Associated mild marrow edema in the posterior aspect of the T10 superior endplate without extension into the posterior elements. Radiology assistant personnel have been notified to put me in telephone contact with the referring physician or the referring physician's clinical representative in order to discuss these findings. Once this communication is established I will issue an addendum to this report for documentation purposes. Electronically Signed: By: Emmit Alexanders M.D. On: 07/31/2022 19:15   CT Lumbar Spine Wo Contrast  Result Date: 07/31/2022 CLINICAL  DATA:  Back trauma.  Pain. EXAM: CT THORACIC AND LUMBAR SPINE WITHOUT CONTRAST TECHNIQUE: Multidetector CT imaging of the thoracic and lumbar spine was performed without contrast. Multiplanar CT image reconstructions were also generated. RADIATION DOSE REDUCTION: This exam was performed according to the departmental dose-optimization program  which includes automated exposure control, adjustment of the mA and/or kV according to patient size and/or use of iterative reconstruction technique. COMPARISON:  None Available. FINDINGS: CT THORACIC SPINE FINDINGS Alignment: Normal. Vertebrae: Contiguous anterior syndesmophytes and ossification of the supraspinous ligament, suggestive of ankylosing spondylitis. Fracture through the T9-10 disc space extending into the posterior aspect of the inferior endplate of T9, through the posterior elements of T9 bilaterally (sagittal images 24 and 34 series 9). Findings suggestive of possible epidural hematoma in the dorsal soft tissues at the level of the fracture with moderate mass effect on the thecal sac (axial image 104 series 4). Paraspinal and other soft tissues: Dilated, fluid-filled esophagus. Small bilateral pleural effusions. Disc levels: No significant degenerative change. CT LUMBAR SPINE FINDINGS Segmentation: Conventional numbering is assumed with 5 non-rib-bearing, lumbar type vertebral bodies. Alignment: Normal. Vertebrae: Contiguous anterior syndesmophytes continue through the L2 level with ossification of the supraspinous ligament continuing through the L1 level. No acute fracture of the lumbar spine. Paraspinal and other soft tissues: Atherosclerotic calcifications of the abdominal aorta and its branches. Disc levels: No significant degenerative change. IMPRESSION: 1. Acute fracture through the T9-10 disc space extending into the posterior aspect of the inferior endplate of T9, through the posterior elements of T9 bilaterally. 2. Findings suggestive of possible epidural hematoma in the dorsal soft tissues at the level of the fracture with moderate mass effect on the thecal sac. 3. Contiguous anterior syndesmophytes and ossification of the supraspinous ligament, suggestive of underlying ankylosing spondylitis. 4. No acute fracture of the lumbar spine. 5. Small bilateral pleural effusions. 6. Dilated,  fluid-filled esophagus. 7. Aortic Atherosclerosis (ICD10-I70.0). Critical Value/emergent results were called by telephone at the time of interpretation on 07/31/2022 at 3:50 pm to provider RILEY RANSOM , who verbally acknowledged these results. Electronically Signed   By: Emmit Alexanders M.D.   On: 07/31/2022 15:56   CT Thoracic Spine Wo Contrast  Result Date: 07/31/2022 CLINICAL DATA:  Back trauma.  Pain. EXAM: CT THORACIC AND LUMBAR SPINE WITHOUT CONTRAST TECHNIQUE: Multidetector CT imaging of the thoracic and lumbar spine was performed without contrast. Multiplanar CT image reconstructions were also generated. RADIATION DOSE REDUCTION: This exam was performed according to the departmental dose-optimization program which includes automated exposure control, adjustment of the mA and/or kV according to patient size and/or use of iterative reconstruction technique. COMPARISON:  None Available. FINDINGS: CT THORACIC SPINE FINDINGS Alignment: Normal. Vertebrae: Contiguous anterior syndesmophytes and ossification of the supraspinous ligament, suggestive of ankylosing spondylitis. Fracture through the T9-10 disc space extending into the posterior aspect of the inferior endplate of T9, through the posterior elements of T9 bilaterally (sagittal images 24 and 34 series 9). Findings suggestive of possible epidural hematoma in the dorsal soft tissues at the level of the fracture with moderate mass effect on the thecal sac (axial image 104 series 4). Paraspinal and other soft tissues: Dilated, fluid-filled esophagus. Small bilateral pleural effusions. Disc levels: No significant degenerative change. CT LUMBAR SPINE FINDINGS Segmentation: Conventional numbering is assumed with 5 non-rib-bearing, lumbar type vertebral bodies. Alignment: Normal. Vertebrae: Contiguous anterior syndesmophytes continue through the L2 level with ossification of the supraspinous ligament continuing through the L1 level. No acute fracture of the  lumbar  spine. Paraspinal and other soft tissues: Atherosclerotic calcifications of the abdominal aorta and its branches. Disc levels: No significant degenerative change. IMPRESSION: 1. Acute fracture through the T9-10 disc space extending into the posterior aspect of the inferior endplate of T9, through the posterior elements of T9 bilaterally. 2. Findings suggestive of possible epidural hematoma in the dorsal soft tissues at the level of the fracture with moderate mass effect on the thecal sac. 3. Contiguous anterior syndesmophytes and ossification of the supraspinous ligament, suggestive of underlying ankylosing spondylitis. 4. No acute fracture of the lumbar spine. 5. Small bilateral pleural effusions. 6. Dilated, fluid-filled esophagus. 7. Aortic Atherosclerosis (ICD10-I70.0). Critical Value/emergent results were called by telephone at the time of interpretation on 07/31/2022 at 3:50 pm to provider RILEY RANSOM , who verbally acknowledged these results. Electronically Signed   By: Emmit Alexanders M.D.   On: 07/31/2022 15:56   DG Knee Complete 4 Views Right  Result Date: 07/31/2022 CLINICAL DATA:  Fall EXAM: RIGHT KNEE - COMPLETE 4+ VIEW COMPARISON:  None Available. FINDINGS: There is an acute comminuted nondisplaced fracture of the proximal fibular diaphysis. There is no dislocation. There is overlying soft tissue swelling. No significant joint effusion. IMPRESSION: Acute comminuted nondisplaced fracture of the proximal fibular diaphysis. Electronically Signed   By: Ronney Asters M.D.   On: 07/31/2022 15:24    Labs: BNP (last 3 results) No results for input(s): "BNP" in the last 8760 hours. Basic Metabolic Panel: Recent Labs  Lab 07/31/22 1630 08/01/22 0558 08/05/22 0425  NA 140 140 137  K 3.7 3.3* 3.9  CL 105 105 106  CO2 24 26 25   GLUCOSE 109* 92 94  BUN 14 14 18   CREATININE 0.92 0.88 0.85  CALCIUM 8.7* 8.4* 8.3*   Liver Function Tests: Recent Labs  Lab 08/01/22 0558  AST 40  ALT 31   ALKPHOS 49  BILITOT 0.3  PROT 5.6*  ALBUMIN 3.0*   No results for input(s): "LIPASE", "AMYLASE" in the last 168 hours. No results for input(s): "AMMONIA" in the last 168 hours. CBC: Recent Labs  Lab 07/31/22 1630 08/01/22 0558  WBC 6.3 5.4  NEUTROABS 4.5  --   HGB 13.3 12.0*  HCT 43.9 39.3  MCV 89.6 89.7  PLT 207 169   Anemia work up No results for input(s): "VITAMINB12", "FOLATE", "FERRITIN", "TIBC", "IRON", "RETICCTPCT" in the last 72 hours. Urinalysis    Component Value Date/Time   COLORURINE YELLOW 12/01/2014 0926   APPEARANCEUR CLEAR 12/01/2014 0926   LABSPEC >=1.030 (A) 12/01/2014 0926   PHURINE 5.5 12/01/2014 0926   GLUCOSEU NEGATIVE 12/01/2014 0926   HGBUR NEGATIVE 12/01/2014 0926   BILIRUBINUR NEGATIVE 12/01/2014 0926   KETONESUR NEGATIVE 12/01/2014 0926   UROBILINOGEN 0.2 12/01/2014 0926   NITRITE NEGATIVE 12/01/2014 0926   LEUKOCYTESUR NEGATIVE 12/01/2014 0926   Sepsis Labs Recent Labs  Lab 07/31/22 1630 08/01/22 0558  WBC 6.3 5.4   Microbiology No results found for this or any previous visit (from the past 240 hour(s)).  Time coordinating discharge: *** minutes  SIGNED: Antonieta Pert, MD  [facility] 08/06/2022, 12:53 PM  If 7PM-7AM, please contact night-coverage [email or web address]

## 2022-08-06 NOTE — Progress Notes (Addendum)
PROGRESS NOTE Kenneth Watts  [id number] DOB: [birth year] DOA: 07/31/2022 PCP: [name], MD  Brief Narrative/Hospital Course: 77 y.o.m w/ obesity, HTN, HLD, OSA on CPAP and nightly O2, COPD, gout, dyspepsia, schizophrenia, anxiety, diverticulosis on 3/13-presented to the ED with complaint of severe back pain after recent 2 falls in the past week and feels legs are weak and give out while walking. He saw orthopedics Dr. Mardelle Matte a few days ago.  X-rays were done showing some compression fractures.  Most recent fall on 3/12 caused severe pain and difficulty walking and hence presented to the ED. In the [id number] labs with unremarkable CBC, BMP. X-ray right knee showed acute comminuted nondisplaced fracture of the proximal fibular diaphysis.CT thoracic and lumbar spine showed: Acute fracture through T9-10 disc space extending into the posterior aspect of the inferior endplate of T9, through the posterior elements of T9 bilaterally.Findings suggestive of possible epidural hematoma in the dorsal soft tissues at the level of the fracture with moderate mass effect on the thecal sac. possible underlying ankylosing spondylitis. MRI thoracic and lumbar spine confirmed the finding noted earlier in the CT scan.   Patient was seen by neurosurgeon Dr. Christella Noa and has been placed on pain management, bedrest and neurosurgery > subsequently removed the bedrest 3/18, seen by PT OT, recommending skilled nursing facility, remains medically stable     Subjective: Seen and examined this morning.  He is resting comfortably. Working with mobility tech Overnight afebrile BP stable  Needed iv Dilaudid 6:20 PM yesterday and getting p.o. oxycodone which she takes at home Reports back pain is controlled  Assessment and Plan: Principal Problem:   Thoracic spine fracture (HCC) Active Problems:   Schizophrenia (HCC)   OSA (obstructive sleep apnea)   Gout   HTN (hypertension)   Hyperlipidemia  Acute T9  fracture with epidural hematoma secondary to fall: CT MRI thoracic and lumbar spine reviewed as above neurosurgery following closely, bedrest removed 3/15, working with PT OT recommending skilled nursing facility.  Continue current pain management Relaxants, no recurrence or worsening of back pain with mobility.Avoiding chemical DVT prophylaxis due to epidural hematoma.    Acute comminuted nondisplaced fracture of the proximal fibular diaphysis: Seen by orthopedics, ankle x-ray and recommended weightbearing as tolerated continue pain control  Fall/deconditioning/impaired mobility: As per patient's son  he was less active for the last few months after hospitalization for GI bleed and iron deficiency anemia.  Just recently started to be more active but he still had not recovered.   He fell twice in last 1 week.  No other neurological deficits noted on exam. Cont  PT OT  Possible mild cognitive impairment for few months: as reported by patient's close friend on 3/18-SLP eval obtained, strongly encouraged to follow-up with outpatient primary care provider.  Patient denies any memory issues, reports he is able to do his finances by himself.  Schizophrenia Anxiety disorder: Mood is stable, continue home Ge[place]  OSA continue CPAP/nocturnal oxygen  Dilated/fluid-filled esophagus noted in the CT scan no symptoms of reflux or gastritis, outpatient follow-up  Aortic atherosclerosis at this time avoiding aspirin antiplatelets due to epidural hematoma needs follow-up with PCP  Class I Obesity:Patient's Body mass index is 33.91 kg/m. : Will benefit with PCP follow-up, weight loss  healthy lifestyle  DVT prophylaxis: SCDs Start: 07/31/22 1843 Code Status:   Code Status: Full Code Family Communication: plan of care discussed with patient at bedside. Patient status is: Inpatient because of back pain Level of care: Med-Surg  Dispo: The patient is from: Home, lives with a friend who takes care of             Anticipated disposition: Skilled nursing facility once approved, remains medically stable for discharge  Objective: Vitals last 24 hrs: Vitals:   08/05/22 0750 08/05/22 1514 08/06/22 0516 08/06/22 0738  BP: (!) 143/67 (!) 159/70 (!) 157/66 (!) 156/71  Pulse: 74 75 61 73  Resp:   18 19  Temp: 98.3 F (36.8 C) 98.4 F (36.9 C) 97.7 F (36.5 C) 98.7 F (37.1 C)  TempSrc: Oral Oral Oral Axillary  SpO2: 96% 95% 100%   Weight:      Height:       Weight change:   Physical Examination: General exam: AAo, weak,older appearing HEENT:Oral mucosa moist, Ear/Nose WNL grossly, dentition normal. Respiratory system: bilaterally clear BS, no use of accessory muscle Cardiovascular system: S1 & S2 +, regular rate. Gastrointestinal system: Abdomen soft, NT,ND,BS+ Nervous System:Alert, awake, moving extremities and grossly nonfocal Extremities: LE ankle edema neg, lower extremities warm Skin: No rashes,no icterus. MSK: Normal muscle bulk,tone, power   Medications reviewed:  Scheduled Meds:  acetaminophen  1,000 mg Oral TID   allopurinol  100 mg Oral Daily   ferrous sulfate  325 mg Oral Q breakfast   sodium chloride flush  3 mL Intravenous Q12H   ziprasidone  60 mg Oral QPM   ziprasidone  80 mg Oral QPM   Continuous Infusions:  methocarbamol (ROBAXIN) IV Stopped (07/31/22 2304)      Diet Order             Diet regular Room service appropriate? Yes; Fluid consistency: Thin  Diet effective now                  Intake/Output Summary (Last 24 hours) at 08/06/2022 1114 Last data filed at 08/06/2022 1033 Gross per 24 hour  Intake 336 ml  Output 850 ml  Net -514 ml   Net IO Since Admission: -2,418.95 mL [08/06/22 1114]  Wt Readings from Last 3 Encounters:  07/31/22 113.4 kg  01/09/21 120.7 kg  01/19/19 118.8 kg     Unresulted Labs (From admission, onward)    None      Data Reviewed: I have personally reviewed following labs and imaging studies CBC: Recent Labs  Lab  07/31/22 1630 08/01/22 0558  WBC 6.3 5.4  NEUTROABS 4.5  --   HGB 13.3 12.0*  HCT 43.9 39.3  MCV 89.6 89.7  PLT 207 [id number]   Basic Metabolic Panel: Recent Labs  Lab 07/31/22 1630 08/01/22 0558 08/05/22 0425  NA 140 140 137  K 3.7 3.3* 3.9  CL 105 105 106  CO2 24 26 25   GLUCOSE 109* 92 94  BUN 14 14 18   CREATININE 0.92 0.88 0.85  CALCIUM 8.7* 8.4* 8.3*  GFR: Estimated Creatinine Clearance: 94.6 mL/min (by C-G formula based on SCr of 0.85 mg/dL). Liver Function Tests: Recent Labs  Lab 08/01/22 0558  AST 40  ALT 31  ALKPHOS 49  BILITOT 0.3  PROT 5.6*  ALBUMIN 3.0*  No results found for this or any previous visit (from the past 240 hour(s)).  Antimicrobials: Anti-infectives (From admission, onward)    None     Culture/Microbiology No results found for: "SDES", "SPECREQUEST", "CULT", "REPTSTATUS"  Other culture-see note   Radiology Studies: No results found.   LOS: 6 days   Antonieta Pert, MD [facility]  08/06/2022, 11:14 AM

## 2022-08-06 NOTE — Progress Notes (Signed)
Pt said he is not not ready for cpap at this time

## 2022-08-06 NOTE — Progress Notes (Signed)
RE:  Kenneth Watts      Date of Birth:  [birth year]     Date:   08/06/22       To Whom It May Concern:  Please be advised that the above-named patient will require a short-term nursing home stay - anticipated 30 days or less for rehabilitation and strengthening.  The plan is for return home.                 MD signature                Date

## 2022-08-06 NOTE — Progress Notes (Signed)
Mobility Specialist Progress Note   08/06/22 0945  Mobility  Activity Ambulated with assistance in hallway;Transferred from bed to chair (to chair after ambulation)  Level of Assistance Minimal assist, patient does 75% or more  Assistive Device Front wheel walker  Distance Ambulated (ft) 40 ft  Range of Motion/Exercises Active;All extremities  Activity Response Tolerated well   Patient received in supine and agreeable to participate. Presented slightly lethargic but arousal with verbal stimuli. Recalled 2/3 back precautions, completed re-edu with receptive teach back but poor carryover by the end of session. Patient forgetful and asked the same questions repetitively. Required mod A and cueing for bed mobility to perform proper log roll. Stood with minimal HHA and ambulated with slow shuffle gait. Returned to room without complaints of pain or incident. Was left in recliner chair with all needs met, call bell in reach.   Kenneth Watts, BS EXP Mobility Specialist Please contact via SecureChat or Rehab office at [phone]

## 2022-08-07 DIAGNOSIS — S22079A Unspecified fracture of T9-T10 vertebra, initial encounter for closed fracture: Secondary | ICD-10-CM | POA: Diagnosis not present

## 2022-08-07 MED ORDER — POLYETHYLENE GLYCOL 3350 17 G PO PACK: 17.0000 g | PACK | Freq: Every day | ORAL | 0 refills | Status: AC | PRN

## 2022-08-07 MED ORDER — DILAUDID 2 MG PO TABS: 1.0000 mg | ORAL_TABLET | Freq: Two times a day (BID) | ORAL | 0 refills | Status: AC | PRN

## 2022-08-07 MED ORDER — OXYCODONE HCL 10 MG PO TABS: 10.0000 mg | ORAL_TABLET | Freq: Three times a day (TID) | ORAL | 0 refills | Status: AC | PRN

## 2022-08-07 MED ORDER — METHOCARBAMOL 750 MG PO TABS: 750.0000 mg | ORAL_TABLET | Freq: Four times a day (QID) | ORAL | 0 refills | Status: AC

## 2022-08-07 MED ORDER — FERROUS SULFATE 325 (65 FE) MG PO TABS: 325.0000 mg | ORAL_TABLET | Freq: Every day | ORAL | 3 refills | Status: AC

## 2022-08-07 MED ORDER — ACETAMINOPHEN 500 MG PO TABS: 1000.0000 mg | ORAL_TABLET | Freq: Three times a day (TID) | ORAL | 0 refills | Status: AC

## 2022-08-07 NOTE — TOC Transition Note (Signed)
Transition of Care [facility]) - CM/SW Discharge Note   Patient Details  Name: Kenneth Watts MRN: [id number] Date of Birth: Oct 11, 1945  [facility]) CM/SW Contact:  Joanne Chars, LCSW Phone Number: 08/07/2022, 11:53 AM   Clinical Narrative:   Pt discharging to [facility], room 3225.  RN call [phone] for report.  Pt will be transported by [employer], wheelchair [place] with arrival time at [employer] entrance at [employer].  They will call RN station prior to their arrival.     Final next level of care: [place] Barriers to Discharge: Barriers Resolved   Patient Goals and CMS Choice   Choice offered to / list presented to : Patient  Discharge Placement                Patient chooses bed at: [place] Patient to be transferred to facility by: Safe transport Name of family member notified: Barbaraann Share, friend, Daryl Eastern, son Patient and family notified of of transfer: 08/07/22  Discharge Plan and Services Additional resources added to the After Visit Summary for   In-house Referral: Clinical Social Work   Post Acute Care Choice: [place]                               Social Determinants of Health (SDOH) Interventions SDOH Screenings   Tobacco Use: Medium Risk (07/31/2022)     Readmission Risk Interventions     No data to display

## 2022-08-07 NOTE — TOC Progression Note (Addendum)
Transition of Care [facility]) - Progression Note    Patient Details  Name: Kenneth Watts MRN: [id number] Date of Birth: 1945/07/07  Transition of Care [facility]) CM/SW Contact  Joanne Chars, LCSW Phone Number: 08/07/2022, 11:55 AM  Clinical Narrative:   CSW confirmed with [facility] that they can accept pt today.  CSW spoke with PT, pt would need wheelchair transportation.  CSW spoke with pt, who does not have transportation to SNF.  Transportation arranged with [employer].   Location manager.  Expected Discharge Plan: [place] Barriers to Discharge: Barriers Resolved  Expected Discharge Plan and Services In-house Referral: Clinical Social Work   Post Acute Care Choice: [place] Living arrangements for the past 2 months: Single Family Home Expected Discharge Date: 08/07/22                                     Social Determinants of Health (SDOH) Interventions SDOH Screenings   Tobacco Use: Medium Risk (07/31/2022)    Readmission Risk Interventions     No data to display

## 2022-08-07 NOTE — Progress Notes (Signed)
Physical Therapy Treatment Patient Details Name: Kenneth Watts MRN: [id number] DOB: 1945/10/28 Today's Date: 08/07/2022   History of Present Illness Pt is a 77 y.o. male presenting 3/13 with back pain after multiple ground-level falls. CT and MRI showed T9 fracture with epidural hematoma and R fibular head fracture. PMH significant for OSA, HTN, HLD, gout, dyspepsia, schizophrenia, anxiety, COPD, L THA and diverticulitis.    PT Comments    Pt continues below baseline. Slowly progressing towards goals. Pt demonstrates the need for Min A with bed mobility and gait with CGA for sit to stand. Pt continues with significant weakness in the Bil LE with IR and low foot clearance during gait with partial step through demonstrating a high risk for falls and requiring Min a to remain upright during functional mobility. Due to pt current functional status, home set up and available assistance recommending skilled physical therapy services at a higher level of care and PT frequency in order to decrease risk for falls, injury, immobility and re-hospitalization. No signs/symptoms of cardiac/respiratory distress throughout session.     Recommendations for follow up therapy are one component of a multi-disciplinary discharge planning process, led by the attending physician.  Recommendations may be updated based on patient status, additional functional criteria and insurance authorization.  Follow Up Recommendations  Skilled nursing-short term rehab (<3 hours/day) Can patient physically be transported by private vehicle: Yes   Assistance Recommended at Discharge Frequent or constant Supervision/Assistance  Patient can return home with the following A little help with walking and/or transfers;A lot of help with bathing/dressing/bathroom;Assistance with cooking/housework;Direct supervision/assist for medications management;Direct supervision/assist for financial management;Assist for transportation;Help with stairs  or ramp for entrance   Equipment Recommendations  Other (comment) (defer to post acute)    Recommendations for Other Services       Precautions / Restrictions Precautions Precautions: Back;Fall Precaution Comments: verbally reviewed back precautions Required Braces or Orthoses: Spinal Brace Spinal Brace: Thoracolumbosacral orthotic Restrictions Weight Bearing Restrictions: No     Mobility  Bed Mobility Overal bed mobility: Needs Assistance Bed Mobility: Sidelying to Sit, Sit to Sidelying Rolling: Min assist Sidelying to sit: Min guard     Sit to sidelying: Min assist General bed mobility comments: verbal cues for log roll technique pt demonstrates poor recall but is able to state all precautions for the back. Physical assistance for bil LE with sitting to side lying. Patient Response: Cooperative  Transfers Overall transfer level: Needs assistance Equipment used: Rolling walker (2 wheels) Transfers: Sit to/from Stand Sit to Stand: Min guard           General transfer comment: no physical assistance required for sit to stand. Pt demonstrates a wide BOS.    Ambulation/Gait Ambulation/Gait assistance: Min assist Gait Distance (Feet): 40 Feet Assistive device: Rolling walker (2 wheels) Gait Pattern/deviations: Step-through pattern, Decreased step length - right, Decreased step length - left Gait velocity: Decreased cadence. Gait velocity interpretation: <1.31 ft/sec, indicative of household ambulator   General Gait Details: IR at the LLE intermittently with very low floor clearance and partial step through gait pattern. Not quite a shuffle. Min a to maintain upright balance with RW.        Balance Overall balance assessment: Needs assistance, History of Falls Sitting-balance support: No upper extremity supported, Feet supported Sitting balance-Leahy Scale: Good     Standing balance support: Bilateral upper extremity supported, Reliant on assistive device for  balance, During functional activity Standing balance-Leahy Scale: Poor Standing balance comment: Min A to  maintian balance with functional activities      Cognition Arousal/Alertness: Awake/alert Behavior During Therapy: WFL for tasks assessed/performed Overall Cognitive Status: No family/caregiver present to determine baseline cognitive functioning       General Comments: pt participates appropriately during session           General Comments General comments (skin integrity, edema, etc.): Pt can be transported with a wheel chair. He will have significant difficulty getting into a car without w/c transport.      Pertinent Vitals/Pain Pain Assessment Pain Assessment: Faces Faces Pain Scale: Hurts little more Breathing: normal Negative Vocalization: occasional moan/groan, low speech, negative/disapproving quality Facial Expression: smiling or inexpressive Body Language: relaxed Consolability: no need to console PAINAD Score: 1 Pain Location: back Pain Descriptors / Indicators: Aching Pain Intervention(s): Monitored during session, Patient requesting pain meds-RN notified     PT Goals (current goals can now be found in the care plan section) Acute Rehab PT Goals Patient Stated Goal: to go to SNF, return to independence PT Goal Formulation: With patient Time For Goal Achievement: 08/19/22 Potential to Achieve Goals: Fair Progress towards PT goals: Progressing toward goals    Frequency    Min 3X/week      PT Plan Current plan remains appropriate       AM-PAC PT "6 Clicks" Mobility   Outcome Measure  Help needed turning from your back to your side while in a flat bed without using bedrails?: A Little Help needed moving from lying on your back to sitting on the side of a flat bed without using bedrails?: A Little Help needed moving to and from a bed to a chair (including a wheelchair)?: A Little Help needed standing up from a chair using your arms (e.g.,  wheelchair or bedside chair)?: A Little Help needed to walk in hospital room?: A Little Help needed climbing 3-5 steps with a railing? : Total 6 Click Score: 16    End of Session Equipment Utilized During Treatment: Back brace Activity Tolerance: Patient tolerated treatment well Patient left: in bed;with call bell/phone within reach;with bed alarm set Nurse Communication: Mobility status PT Visit Diagnosis: Other abnormalities of gait and mobility (R26.89);Muscle weakness (generalized) (M62.81);Pain Pain - Right/Left:  (central) Pain - part of body:  (back)     Time: [id number] PT Time Calculation (min) (ACUTE ONLY): 16 min  Charges:  $Therapeutic Activity: 8-22 mins                     Kenneth Watts, DPT, CLT  Acute Rehabilitation Services Office: [phone] (Secure chat preferred)    Ander Purpura 08/07/2022, 1:36 PM

## 2022-09-25 ENCOUNTER — Ambulatory Visit (HOSPITAL_COMMUNITY): Payer: Medicare Other

## 2022-11-25 DIAGNOSIS — S22078K Other fracture of T9-T10 vertebra, subsequent encounter for fracture with nonunion: Secondary | ICD-10-CM | POA: Diagnosis not present

## 2022-11-25 DIAGNOSIS — Z6832 Body mass index (BMI) 32.0-32.9, adult: Secondary | ICD-10-CM | POA: Diagnosis not present

## 2022-12-11 DIAGNOSIS — E669 Obesity, unspecified: Secondary | ICD-10-CM | POA: Diagnosis not present

## 2022-12-11 DIAGNOSIS — F259 Schizoaffective disorder, unspecified: Secondary | ICD-10-CM | POA: Diagnosis not present

## 2023-03-21 DIAGNOSIS — I10 Essential (primary) hypertension: Secondary | ICD-10-CM | POA: Diagnosis not present

## 2023-03-21 DIAGNOSIS — F259 Schizoaffective disorder, unspecified: Secondary | ICD-10-CM | POA: Diagnosis not present

## 2023-03-21 DIAGNOSIS — R296 Repeated falls: Secondary | ICD-10-CM | POA: Diagnosis not present

## 2023-03-21 DIAGNOSIS — G4733 Obstructive sleep apnea (adult) (pediatric): Secondary | ICD-10-CM | POA: Diagnosis not present

## 2023-03-21 DIAGNOSIS — D2270 Melanocytic nevi of unspecified lower limb, including hip: Secondary | ICD-10-CM | POA: Diagnosis not present

## 2023-03-21 DIAGNOSIS — Z85828 Personal history of other malignant neoplasm of skin: Secondary | ICD-10-CM | POA: Diagnosis not present

## 2023-03-21 DIAGNOSIS — L821 Other seborrheic keratosis: Secondary | ICD-10-CM | POA: Diagnosis not present

## 2023-03-21 DIAGNOSIS — D225 Melanocytic nevi of trunk: Secondary | ICD-10-CM | POA: Diagnosis not present

## 2023-03-21 DIAGNOSIS — D2260 Melanocytic nevi of unspecified upper limb, including shoulder: Secondary | ICD-10-CM | POA: Diagnosis not present

## 2023-03-21 DIAGNOSIS — Z23 Encounter for immunization: Secondary | ICD-10-CM | POA: Diagnosis not present

## 2023-03-21 DIAGNOSIS — Z6834 Body mass index (BMI) 34.0-34.9, adult: Secondary | ICD-10-CM | POA: Diagnosis not present

## 2023-03-21 DIAGNOSIS — E669 Obesity, unspecified: Secondary | ICD-10-CM | POA: Diagnosis not present

## 2023-03-21 DIAGNOSIS — D649 Anemia, unspecified: Secondary | ICD-10-CM | POA: Diagnosis not present

## 2023-03-21 DIAGNOSIS — Z85820 Personal history of malignant melanoma of skin: Secondary | ICD-10-CM | POA: Diagnosis not present

## 2023-07-24 DIAGNOSIS — F5105 Insomnia due to other mental disorder: Secondary | ICD-10-CM | POA: Diagnosis not present

## 2023-07-24 DIAGNOSIS — F259 Schizoaffective disorder, unspecified: Secondary | ICD-10-CM | POA: Diagnosis not present

## 2023-07-24 DIAGNOSIS — E663 Overweight: Secondary | ICD-10-CM | POA: Diagnosis not present

## 2023-09-26 DIAGNOSIS — D229 Melanocytic nevi, unspecified: Secondary | ICD-10-CM | POA: Diagnosis not present

## 2023-09-26 DIAGNOSIS — Z85820 Personal history of malignant melanoma of skin: Secondary | ICD-10-CM | POA: Diagnosis not present

## 2023-09-26 DIAGNOSIS — L570 Actinic keratosis: Secondary | ICD-10-CM | POA: Diagnosis not present

## 2023-09-26 DIAGNOSIS — Z85828 Personal history of other malignant neoplasm of skin: Secondary | ICD-10-CM | POA: Diagnosis not present

## 2023-09-26 DIAGNOSIS — D239 Other benign neoplasm of skin, unspecified: Secondary | ICD-10-CM | POA: Diagnosis not present

## 2023-12-05 DIAGNOSIS — F259 Schizoaffective disorder, unspecified: Secondary | ICD-10-CM | POA: Diagnosis not present

## 2023-12-05 DIAGNOSIS — I10 Essential (primary) hypertension: Secondary | ICD-10-CM | POA: Diagnosis not present

## 2023-12-05 DIAGNOSIS — Z0000 Encounter for general adult medical examination without abnormal findings: Secondary | ICD-10-CM | POA: Diagnosis not present

## 2023-12-05 DIAGNOSIS — G4733 Obstructive sleep apnea (adult) (pediatric): Secondary | ICD-10-CM | POA: Diagnosis not present

## 2023-12-05 DIAGNOSIS — Z1331 Encounter for screening for depression: Secondary | ICD-10-CM | POA: Diagnosis not present

## 2023-12-05 DIAGNOSIS — Z125 Encounter for screening for malignant neoplasm of prostate: Secondary | ICD-10-CM | POA: Diagnosis not present

## 2023-12-05 DIAGNOSIS — R634 Abnormal weight loss: Secondary | ICD-10-CM | POA: Diagnosis not present

## 2023-12-05 DIAGNOSIS — M109 Gout, unspecified: Secondary | ICD-10-CM | POA: Diagnosis not present

## 2023-12-05 DIAGNOSIS — D649 Anemia, unspecified: Secondary | ICD-10-CM | POA: Diagnosis not present

## 2023-12-22 DIAGNOSIS — C4441 Basal cell carcinoma of skin of scalp and neck: Secondary | ICD-10-CM | POA: Diagnosis not present

## 2023-12-22 DIAGNOSIS — Z85828 Personal history of other malignant neoplasm of skin: Secondary | ICD-10-CM | POA: Diagnosis not present

## 2023-12-22 DIAGNOSIS — L570 Actinic keratosis: Secondary | ICD-10-CM | POA: Diagnosis not present

## 2023-12-22 DIAGNOSIS — Z85820 Personal history of malignant melanoma of skin: Secondary | ICD-10-CM | POA: Diagnosis not present

## 2023-12-22 DIAGNOSIS — L814 Other melanin hyperpigmentation: Secondary | ICD-10-CM | POA: Diagnosis not present

## 2023-12-22 DIAGNOSIS — D492 Neoplasm of unspecified behavior of bone, soft tissue, and skin: Secondary | ICD-10-CM | POA: Diagnosis not present

## 2023-12-22 DIAGNOSIS — D239 Other benign neoplasm of skin, unspecified: Secondary | ICD-10-CM | POA: Diagnosis not present

## 2023-12-31 DIAGNOSIS — C4441 Basal cell carcinoma of skin of scalp and neck: Secondary | ICD-10-CM | POA: Diagnosis not present

## 2024-01-14 ENCOUNTER — Ambulatory Visit
Admission: RE | Admit: 2024-01-14 | Discharge: 2024-01-14 | Disposition: A | Discharge status: Home / Self Care | Source: Ambulatory Visit | Attending: Infectious Diseases | Admitting: Infectious Diseases

## 2024-01-14 ENCOUNTER — Other Ambulatory Visit: Payer: Self-pay | Admitting: Infectious Diseases

## 2024-01-14 DIAGNOSIS — R1032 Left lower quadrant pain: Secondary | ICD-10-CM | POA: Diagnosis not present

## 2024-01-14 DIAGNOSIS — D649 Anemia, unspecified: Secondary | ICD-10-CM | POA: Diagnosis not present

## 2024-01-14 DIAGNOSIS — R634 Abnormal weight loss: Secondary | ICD-10-CM | POA: Diagnosis not present

## 2024-01-14 DIAGNOSIS — E669 Obesity, unspecified: Secondary | ICD-10-CM | POA: Diagnosis not present

## 2024-01-14 DIAGNOSIS — G4733 Obstructive sleep apnea (adult) (pediatric): Secondary | ICD-10-CM | POA: Diagnosis not present

## 2024-01-14 DIAGNOSIS — E782 Mixed hyperlipidemia: Secondary | ICD-10-CM | POA: Diagnosis not present

## 2024-01-14 DIAGNOSIS — M109 Gout, unspecified: Secondary | ICD-10-CM | POA: Diagnosis not present

## 2024-01-14 DIAGNOSIS — Z125 Encounter for screening for malignant neoplasm of prostate: Secondary | ICD-10-CM | POA: Diagnosis not present

## 2024-01-14 DIAGNOSIS — R309 Painful micturition, unspecified: Secondary | ICD-10-CM | POA: Diagnosis not present

## 2024-01-14 DIAGNOSIS — Z1331 Encounter for screening for depression: Secondary | ICD-10-CM | POA: Diagnosis not present

## 2024-01-14 DIAGNOSIS — E66812 Obesity, class 2: Secondary | ICD-10-CM | POA: Diagnosis not present

## 2024-01-14 DIAGNOSIS — N2889 Other specified disorders of kidney and ureter: Secondary | ICD-10-CM | POA: Diagnosis not present

## 2024-01-14 DIAGNOSIS — I10 Essential (primary) hypertension: Secondary | ICD-10-CM | POA: Diagnosis not present

## 2024-01-14 DIAGNOSIS — E6609 Other obesity due to excess calories: Secondary | ICD-10-CM | POA: Diagnosis not present

## 2024-01-14 DIAGNOSIS — Z6837 Body mass index (BMI) 37.0-37.9, adult: Secondary | ICD-10-CM | POA: Diagnosis not present

## 2024-01-14 DIAGNOSIS — K5732 Diverticulitis of large intestine without perforation or abscess without bleeding: Secondary | ICD-10-CM | POA: Diagnosis not present

## 2024-01-14 DIAGNOSIS — F259 Schizoaffective disorder, unspecified: Secondary | ICD-10-CM | POA: Diagnosis not present

## 2024-01-14 MED ORDER — IOHEXOL 300 MG/ML  SOLN
100.0000 mL | Freq: Once | INTRAMUSCULAR | Status: AC | PRN
  Administered 2024-01-14: 100 mL via INTRAVENOUS

## 2024-01-21 DIAGNOSIS — F259 Schizoaffective disorder, unspecified: Secondary | ICD-10-CM | POA: Diagnosis not present

## 2024-01-21 DIAGNOSIS — G2401 Drug induced subacute dyskinesia: Secondary | ICD-10-CM | POA: Diagnosis not present

## 2024-01-21 DIAGNOSIS — F5105 Insomnia due to other mental disorder: Secondary | ICD-10-CM | POA: Diagnosis not present

## 2024-02-26 DIAGNOSIS — F411 Generalized anxiety disorder: Secondary | ICD-10-CM | POA: Diagnosis not present

## 2024-02-26 DIAGNOSIS — F209 Schizophrenia, unspecified: Secondary | ICD-10-CM | POA: Diagnosis not present

## 2024-03-11 DIAGNOSIS — F411 Generalized anxiety disorder: Secondary | ICD-10-CM | POA: Diagnosis not present

## 2024-03-11 DIAGNOSIS — F209 Schizophrenia, unspecified: Secondary | ICD-10-CM | POA: Diagnosis not present

## 2024-04-09 DIAGNOSIS — F209 Schizophrenia, unspecified: Secondary | ICD-10-CM | POA: Diagnosis not present

## 2024-04-09 DIAGNOSIS — F411 Generalized anxiety disorder: Secondary | ICD-10-CM | POA: Diagnosis not present
